# Patient Record
Sex: Male | Born: 1950 | Race: White | Hispanic: No | Smoking: Never smoker
Health system: Southern US, Community
[De-identification: ages and names within clinical notes are randomized; demographics above are authoritative.]

## PROBLEM LIST (undated history)

## (undated) DIAGNOSIS — E119 Type 2 diabetes mellitus without complications: Secondary | ICD-10-CM

## (undated) DIAGNOSIS — E785 Hyperlipidemia, unspecified: Secondary | ICD-10-CM

## (undated) HISTORY — DX: Type 2 diabetes mellitus without complications: E11.9

## (undated) HISTORY — PX: REPLACEMENT TOTAL KNEE: SUR1224

## (undated) HISTORY — DX: Hyperlipidemia, unspecified: E78.5

---

## 2018-12-14 ENCOUNTER — Encounter: Payer: Self-pay | Admitting: Internal Medicine

## 2019-02-03 ENCOUNTER — Other Ambulatory Visit: Payer: Self-pay

## 2019-02-05 ENCOUNTER — Ambulatory Visit (INDEPENDENT_AMBULATORY_CARE_PROVIDER_SITE_OTHER): Payer: Medicare Other | Admitting: Internal Medicine

## 2019-02-05 ENCOUNTER — Other Ambulatory Visit: Payer: Self-pay

## 2019-02-05 ENCOUNTER — Encounter: Payer: Self-pay | Admitting: Internal Medicine

## 2019-02-05 VITALS — BP 136/78 | HR 86 | Temp 99.3°F | Ht 68.0 in | Wt 323.4 lb

## 2019-02-05 DIAGNOSIS — E1165 Type 2 diabetes mellitus with hyperglycemia: Secondary | ICD-10-CM | POA: Diagnosis not present

## 2019-02-05 DIAGNOSIS — E785 Hyperlipidemia, unspecified: Secondary | ICD-10-CM | POA: Diagnosis not present

## 2019-02-05 DIAGNOSIS — R739 Hyperglycemia, unspecified: Secondary | ICD-10-CM

## 2019-02-05 MED ORDER — TRULICITY 0.75 MG/0.5ML ~~LOC~~ SOAJ: 0.7500 mg | SUBCUTANEOUS | 3 refills | Status: DC

## 2019-02-05 MED ORDER — GLIPIZIDE 5 MG PO TABS: 10.0000 mg | ORAL_TABLET | Freq: Every day | ORAL | 2 refills | Status: DC

## 2019-02-05 NOTE — Patient Instructions (Signed)
-   DO NOT start Acarbose  - STOP Glipizide XL  - Start Glipizide 5 mg, Two tablets before Breakfast  - Start Trulicity 1.82 mg weekly  - Continue Metformin 850 mg Two  Tablets daily    - Check sugar fasting and bedtime , you can check before you eat as well     HOW TO TREAT LOW BLOOD SUGARS (Blood sugar LESS THAN 70 MG/DL)  Please follow the RULE OF 15 for the treatment of hypoglycemia treatment (when your (blood sugars are less than 70 mg/dL)    STEP 1: Take 15 grams of carbohydrates when your blood sugar is low, which includes:   3-4 GLUCOSE TABS  OR  3-4 OZ OF JUICE OR REGULAR SODA OR  ONE TUBE OF GLUCOSE GEL     STEP 2: RECHECK blood sugar in 15 MINUTES STEP 3: If your blood sugar is still low at the 15 minute recheck --> then, go back to STEP 1 and treat AGAIN with another 15 grams of carbohydrates.

## 2019-02-05 NOTE — Progress Notes (Signed)
Name: Isaac Hamilton  MRN/ DOB: [id number], 06-Sep-1950   Age/ Sex: 68 y.o., male    PCP: Sarajane Jews, [place]   Reason for Endocrinology Evaluation: Type 2 Diabetes Mellitus     Date of Initial Endocrinology Visit: 02/06/2019     PATIENT IDENTIFIER: Isaac Hamilton is a 68 y.o. male with a past medical history of T2Dm, HTN . The patient presented for initial endocrinology clinic visit on 02/06/2019 for consultative assistance with his diabetes management.    HPI: Isaac Hamilton was    Diagnosed with T2DM  ~ 18 yrs ago  Prior Medications tried/Intolerance: Actos  Currently checking blood sugars 2-3 x / day,  before meals and bedtime  Hypoglycemia episodes : no     Hemoglobin A1c peaking at 9.4% in 2020. Patient required assistance for hypoglycemia:  Patient has required hospitalization within the last 1 year from hyper or hypoglycemia: no  In terms of diet, the patient eats a breakfast , cheese and crackers for lunch and a pop corn for supper, does not drink sugar-sweetened beverages.    HOME DIABETES REGIMEN: Acarbose 25 mg TID - Did not start yet  Glipizide ER 5 mg daily  Metformin 850 mg 2 tabs daily   Statin: yes ACE-I/ARB: No Prior Diabetic Education: yes   GLUCOSE LOG: BG's > 200    DIABETIC COMPLICATIONS: Microvascular complications:    Denies: CKD, neuropathy  Last eye exam: Completed 2019  Macrovascular complications:    Denies: CAD, PVD, CVA   PAST HISTORY: Past Medical History:  Past Medical History:  Diagnosis Date  . Diabetes mellitus ([place])   . Dyslipidemia    Past Surgical History:  Past Surgical History:  Procedure Laterality Date  . REPLACEMENT TOTAL KNEE Left       Social History:  reports that he has never smoked. He has never used smokeless tobacco. He reports that he does not drink alcohol. No history on file for drug. Family History:  Family History  Problem Relation Age of Onset  . Diabetes Mother   . Diabetes Sister       HOME MEDICATIONS: Allergies as of 02/05/2019   No Known Allergies     Medication List       Accurate as of February 05, 2019 11:59 PM. If you have any questions, ask your nurse or doctor.        STOP taking these medications   glipiZIDE 5 MG 24 hr tablet Commonly known as: GLUCOTROL XL Replaced by: glipiZIDE 5 MG tablet Stopped by: Dorita Sciara, MD     TAKE these medications   aspirin 81 MG chewable tablet Chew by mouth daily.   atorvastatin 10 MG tablet Commonly known as: LIPITOR TAKE 1 TABLET BY MOUTH ONCE PER DAY FOR 90 DAYS AT NIGHT (BLOOD WORK 90 DAYS)   citalopram 40 MG tablet Commonly known as: CELEXA Take 40 mg by mouth daily.   diclofenac 75 MG EC tablet Commonly known as: VOLTAREN TAKE 1 TABLET BY MOUTH 2 TIMES PER DAY FOR 90 DAYS   FreeStyle Libre 14 Day Sensor Misc See admin instructions.   glipiZIDE 5 MG tablet Commonly known as: GLUCOTROL Take 2 tablets (10 mg total) by mouth daily before breakfast. Replaces: glipiZIDE 5 MG 24 hr tablet Started by: Dorita Sciara, MD   loratadine 10 MG tablet Commonly known as: CLARITIN Take 10 mg by mouth daily.   meloxicam 7.5 MG tablet Commonly known as: MOBIC Take 7.5 mg by mouth  daily.   metFORMIN 850 MG tablet Commonly known as: GLUCOPHAGE TAKE 2 TABLETS BY MOUTH ONCE PER DAY FOR 90 DAYS WITH LARGEST MEAL OF THE DAY   Trulicity 0.75 MG/0.5ML Sopn Generic drug: Dulaglutide Inject 0.75 mg into the skin once a week. Started by: Scarlette ShortsIbtehal J , MD        ALLERGIES: No Known Allergies   REVIEW OF SYSTEMS: A comprehensive ROS was conducted with the patient and is negative except as per HPI and below:  Review of Systems  Constitutional: Negative for fever and weight loss.  HENT: Negative for congestion and sore throat.   Eyes: Negative for blurred vision and pain.  Respiratory: Negative for cough and shortness of breath.   Cardiovascular: Negative for chest pain and palpitations.   Gastrointestinal: Positive for constipation and diarrhea. Negative for nausea.       Alternating constipation and diarrhea.   Genitourinary: Positive for frequency.  Neurological: Positive for tingling. Negative for tremors.       Right middle finger  Endo/Heme/Allergies: Positive for polydipsia.  Psychiatric/Behavioral: Positive for depression. The patient is not nervous/anxious.       OBJECTIVE:   VITAL SIGNS: BP 136/78 (BP Location: Left Arm, Patient Position: Sitting, Cuff Size: Large)   Pulse 86   Temp 99.3 F (37.4 C)   Ht 5\' 8"  (1.727 m)   Wt (!) 323 lb 6.4 oz (146.7 kg)   SpO2 96%   BMI 49.17 kg/m    PHYSICAL EXAM:  General: Pt appears well and is in NAD  Hydration: Well-hydrated with moist mucous membranes and good skin turgor  HEENT: Head: Unremarkable with good dentition. Oropharynx clear without exudate.  Eyes: External eye exam normal without stare, lid lag or exophthalmos.  EOM intact.  PERRL.  Neck: General: Supple without adenopathy or carotid bruits. Thyroid: Thyroid size normal.  No goiter or nodules appreciated. No thyroid bruit.  Lungs: Clear with good BS bilat with no rales, rhonchi, or wheezes  Heart: RRR with normal S1 and S2 and no gallops; no murmurs; no rub  Abdomen: Normoactive bowel sounds, soft, nontender, without masses or organomegaly palpable  Extremities:  Lower extremities - trace pretibial edema. No lesions.  Skin: Normal texture and temperature to palpation. No rash noted. No Acanthosis nigricans/skin tags.   Neuro: MS is good with appropriate affect, pt is alert and Ox3    DM foot exam:   The skin of the feet is intact without sores or ulcerations. The pedal pulses are 2+ on right and 2+ on left. The sensation is intact to a screening 5.07, 10 gram monofilament bilaterally   DATA REVIEWED: 12/14/2018 Gluc 419 mg /dL  Bun/Cr 16/1.0911/0.95  GFR 82  TG 156 Urine alb/cr ratio 5  A1c 9.4% TSH 1.4   Old records , labs and images have  been reviewed.    ASSESSMENT / PLAN / RECOMMENDATIONS:   1) Type 2 Diabetes Mellitus, Poorly controlled, Without complications - Most recent A1c of 9.4 %. Goal A1c < 7.0 %.   Plan: GENERAL: I have discussed with the patient the pathophysiology of diabetes. We went over the natural progression of the disease. We talked about both insulin resistance and insulin deficiency. We stressed the importance of lifestyle changes including diet and exercise. I explained the complications associated with diabetes including retinopathy, nephropathy, neuropathy as well as increased risk of cardiovascular disease. We went over the benefit seen with glycemic control.   I explained to the patient that diabetic patients are  at higher than normal risk for amputations.   We discussed the importance of eating consistent meals during the day and avoiding snacks.   He avoids sugar-sweetened beverages  Will switch Glipizide XL to regular Glipizide, due to  Increased risk of hypoglycemia with extended release formulation in people ages > 8065.   Discussed starting a GLp-1 agonists to help with glucose and weight loss, discussed GI side effects of nausea and diarrhea. Will hold off on starting acarbose at this time  Will set him up to see our CDE on next visit with me   MEDICATIONS: - Stop Glipizide XL - Do not start Acarbose - Start Glipizide 5 mg, Two tablets before Breakfast  - Start Trulicity 0.75 mg weekly  - Continue Metformin 850 mg Two  Tablets daily    EDUCATION / INSTRUCTIONS:  BG monitoring instructions: Patient is instructed to check his blood sugars 2 times a day, before meals and bedtime.  Call [place] [facility] if: BG persistently < 70 or > 300. . I reviewed the Rule of 15 for the treatment of hypoglycemia in detail with the patient. Literature supplied.   2) Diabetic complications:   Eye: Does not have known diabetic retinopathy.   Neuro/ Feet: Does not have known diabetic  peripheral neuropathy.  Renal: Patient does not have known baseline CKD. He isnot on an ACEI/ARB at present   3) Lipids: Patient is  on a statin. LDL is at goal < 100 mg/dL.    F/u in 2 months       Signed electronically by: Lyndle HerrlichAbby Jaralla , MD  [facility]  [facility] [address] [place], [place] [address] Phone: [phone] FAX: [phone]   CC: Baldo AshMartin, Michael, FNP [address] [place] [address] Phone: [phone]  Fax: [phone]    Return to Endocrinology clinic as below: No future appointments.

## 2019-02-06 ENCOUNTER — Encounter: Payer: Self-pay | Admitting: Internal Medicine

## 2019-02-06 DIAGNOSIS — E1165 Type 2 diabetes mellitus with hyperglycemia: Secondary | ICD-10-CM | POA: Insufficient documentation

## 2019-02-06 DIAGNOSIS — E785 Hyperlipidemia, unspecified: Secondary | ICD-10-CM | POA: Insufficient documentation

## 2019-02-25 ENCOUNTER — Telehealth: Payer: Self-pay | Admitting: Internal Medicine

## 2019-02-25 NOTE — Telephone Encounter (Signed)
Fax was received with this information and has been filled out and given to physician for signature.

## 2019-02-25 NOTE — Telephone Encounter (Signed)
Marcello Moores called from [employer] regarding status of PA for Trulicity  Call back number is [phone]  Ref [facility]  Message: calling regarding insurance information

## 2019-02-26 NOTE — Telephone Encounter (Signed)
Spoke to pt and he stated that he has a different card for prescription coverage. I informed pt that we do not have that card on file and asked that he bring it in to have scanned into his chart.

## 2019-02-26 NOTE — Telephone Encounter (Signed)
Paperwork was filled out and faxed to Isaac Hamilton then sent a return faxed stating that pt does not have active coverage Medicare part D. I called to inform pt and he stated that he was told that if that was not covered then something else would be sent. Please advise

## 2019-03-14 ENCOUNTER — Other Ambulatory Visit: Payer: Self-pay

## 2019-03-14 ENCOUNTER — Telehealth: Payer: Self-pay | Admitting: Internal Medicine

## 2019-03-14 MED ORDER — METFORMIN HCL 850 MG PO TABS: ORAL_TABLET | ORAL | 0 refills | Status: DC

## 2019-03-14 NOTE — Telephone Encounter (Signed)
Sent!

## 2019-03-14 NOTE — Telephone Encounter (Signed)
Pt called needing a refill for metFORMIN (GLUCOPHAGE) 850 MG tablet.  [facility]acy is [id number] [place], [place] - [address]  Call pt @ [phone]

## 2019-04-10 ENCOUNTER — Other Ambulatory Visit: Payer: Self-pay

## 2019-04-10 ENCOUNTER — Encounter: Payer: Medicare Other | Attending: Internal Medicine | Admitting: Dietician

## 2019-04-10 ENCOUNTER — Encounter: Payer: Self-pay | Admitting: Dietician

## 2019-04-10 DIAGNOSIS — E1165 Type 2 diabetes mellitus with hyperglycemia: Secondary | ICD-10-CM | POA: Diagnosis not present

## 2019-04-10 NOTE — Progress Notes (Signed)
Diabetes Self-Management Education  Visit Type: First/Initial  Appt. Start Time: 1040 Appt. End Time: 1200  04/13/2019  Mr. Isaac Hamilton, identified by name and date of birth, is a 68 y.o. male with a diagnosis of Diabetes: Type 2.   ASSESSMENT Patient is here today alone.  He would like to learn how to keep the BG below 180.  States that the only time he saw 100 was when he fasted for 18 hours due to being exhausted after traveling.    History includes: Type 2 diabetes since about Nov 18, 2000, HTN. Weight today 328 lbs A1C 9.4% 2020 Medications include Glipizide, Metformin, Trulicity  Patient lives alone.  His wife died in 11/19/2015 or 11-18-2016.  He moved from [place] in 2017/11/18 to be closer to his son.  He is a retired Engineer, maintenance (IT).  He does his own shopping and cooking.  Cooks little and very simple.  Few vegetables and fruits.   Height 5\' 10"  (1.778 m), weight (!) 328 lb (148.8 kg). Body mass index is 47.06 kg/m.  Diabetes Self-Management Education - 04/10/19 1107      Visit Information   Visit Type  First/Initial      Initial Visit   Diabetes Type  Type 2    Are you currently following a meal plan?  No    Are you taking your medications as prescribed?  Yes    Date Diagnosed  11-18-2000      Health Coping   How would you rate your overall health?  Fair      Psychosocial Assessment   Patient Belief/Attitude about Diabetes  Motivated to manage diabetes    Self-care barriers  None    Self-management support  Doctor's office    Other persons present  Patient    Patient Concerns  Nutrition/Meal planning;Glycemic Control    Special Needs  None    Preferred Learning Style  No preference indicated    Learning Readiness  Ready    How often do you need to have someone help you when you read instructions, pamphlets, or other written materials from your doctor or pharmacy?  1 - Never    What is the last grade level you completed in school?  5 years college, Master's degree      Pre-Education Assessment   Patient understands the diabetes disease and treatment process.  Needs Review    Patient understands incorporating nutritional management into lifestyle.  Needs Review    Patient undertands incorporating physical activity into lifestyle.  Needs Review    Patient understands using medications safely.  Needs Review    Patient understands monitoring blood glucose, interpreting and using results  Needs Review    Patient understands prevention, detection, and treatment of acute complications.  Needs Review    Patient understands prevention, detection, and treatment of chronic complications.  Needs Review    Patient understands how to develop strategies to address psychosocial issues.  Needs Review    Patient understands how to develop strategies to promote health/change behavior.  Needs Review      Complications   Last HgB A1C per patient/outside source  9.4 %   2020   How often do you check your blood sugar?  > 4 times/day   FreeStyle Libre   Fasting Blood glucose range (mg/dL)  130-179;180-200    Postprandial Blood glucose range (mg/dL)  >200    Number of hypoglycemic episodes per month  0    Number of hyperglycemic episodes per week  21  Can you tell when your blood sugar is high?  Yes    What do you do if your blood sugar is high?  drinks water    Have you had a dilated eye exam in the past 12 months?  No    Have you had a dental exam in the past 12 months?  No   no teeth, doesn't wear dentures   Are you checking your feet?  Yes    How many days per week are you checking your feet?  7      Dietary Intake   Breakfast  eggs, sausage, 2 slices [employer] bread, butter, jelly, french fries OR salad with cheese, bacon, raisins, [place] dressing, roasted chicken, wheat crackers    Snack (morning)  none    Lunch  skips    Dinner  mostly skips, occasional burger or popcorn or toast or cheese    Snack (evening)  slim jims, popcorn    Beverage(s)  water, Sugar free peach drink, diet Pepsi,  [employer]bucks vanilla sugar free syrup      Exercise   Exercise Type  Light (walking / raking leaves)   walks to shooting range     Patient Education   Previous Diabetes Education  Yes (please comment)   18 years ago when diagnosed   Disease state   Definition of diabetes, type 1 and 2, and the diagnosis of diabetes    Nutrition management   Role of diet in the treatment of diabetes and the relationship between the three main macronutrients and blood glucose level;Information on hints to eating out and maintain blood glucose control.;Meal options for control of blood glucose level and chronic complications.;Meal timing in regards to the patients' current diabetes medication.    Physical activity and exercise   Role of exercise on diabetes management, blood pressure control and cardiac health.;Helped patient identify appropriate exercises in relation to his/her diabetes, diabetes complications and other health issue.    Medications  Reviewed patients medication for diabetes, action, purpose, timing of dose and side effects.    Monitoring  Purpose and frequency of SMBG.;Identified appropriate SMBG and/or A1C goals.    Acute complications  Taught treatment of hypoglycemia - the 15 rule.;Discussed and identified patients' treatment of hyperglycemia.    Chronic complications  Relationship between chronic complications and blood glucose control;Identified and discussed with patient  current chronic complications    Psychosocial adjustment  Worked with patient to identify barriers to care and solutions;Role of stress on diabetes;Identified and addressed patients feelings and concerns about diabetes      Individualized Goals (developed by patient)   Nutrition  General guidelines for healthy choices and portions discussed    Physical Activity  Exercise 3-5 times per week;15 minutes per day    Medications  take my medication as prescribed    Monitoring   test my blood glucose as discussed       Post-Education Assessment   Patient understands the diabetes disease and treatment process.  Demonstrates understanding / competency    Patient understands incorporating nutritional management into lifestyle.  Needs Review    Patient undertands incorporating physical activity into lifestyle.  Demonstrates understanding / competency    Patient understands using medications safely.  Demonstrates understanding / competency    Patient understands monitoring blood glucose, interpreting and using results  Demonstrates understanding / competency    Patient understands prevention, detection, and treatment of acute complications.  Demonstrates understanding / competency    Patient understands prevention, detection,  and treatment of chronic complications.  Demonstrates understanding / competency    Patient understands how to develop strategies to address psychosocial issues.  Demonstrates understanding / competency    Patient understands how to develop strategies to promote health/change behavior.  Needs Review      Outcomes   Expected Outcomes  Other (comment)   demonstrated interest in learning but question ability and desire to change   Future DMSE  6 months    Program Status  Completed       Individualized Plan for Diabetes Self-Management Training:   Learning Objective:  Patient will have a greater understanding of diabetes self-management. Patient education plan is to attend individual and/or group sessions per assessed needs and concerns.   Plan:   Patient Instructions  Consider using less raisins and less dressing in your salad. Aim for 3 meals per day rather than grazing. Have a healthy frozen meal if you are not able to cook. If you are hungry for a snack then choose something that is protein without carbohydrates. Bake rather than fry. Increase your vegetables. Choose 1-2 pieces of fruit per day with a meal. Spread the amount of carbohydrate throughout the day.  Aim for 3-4 Carb  Choices per meal (45-60 grams) +/- 1 either way  Include protein in moderation with your meals and snacks Consider reading food labels for Total Carbohydrate of foods Find something to be active. Continue checking BG at alternate times per day  Continue taking medication as directed by MD      Expected Outcomes:  Other (comment)(demonstrated interest in learning but question ability and desire to change)  Education material provided: ADA - How to Thrive: A Guide for Your Journey with Diabetes, Meal plan card, Snack sheet and Carbohydrate counting sheet, eating out tips  If problems or questions, patient to contact team via:  Phone  Future DSME appointment: 6 months

## 2019-04-10 NOTE — Patient Instructions (Addendum)
Consider using less raisins and less dressing in your salad. Aim for 3 meals per day rather than grazing. Have a healthy frozen meal if you are not able to cook. If you are hungry for a snack then choose something that is protein without carbohydrates. Bake rather than fry. Increase your vegetables. Choose 1-2 pieces of fruit per day with a meal. Spread the amount of carbohydrate throughout the day.  Aim for 3-4 Carb Choices per meal (45-60 grams) +/- 1 either way  Include protein in moderation with your meals and snacks Consider reading food labels for Total Carbohydrate of foods Find something to be active. Continue checking BG at alternate times per day  Continue taking medication as directed by MD

## 2019-04-11 ENCOUNTER — Telehealth: Payer: Self-pay

## 2019-04-11 NOTE — Telephone Encounter (Signed)
Pt was in office yesterday to see Mickel Baas and pt brought in correct insurance card so that I could restart PA for trulicity. PA has been sent thru covermymeds and awaiting decision.

## 2019-05-05 ENCOUNTER — Telehealth: Payer: Self-pay | Admitting: Internal Medicine

## 2019-05-05 ENCOUNTER — Other Ambulatory Visit: Payer: Self-pay

## 2019-05-05 MED ORDER — FREESTYLE LIBRE 14 DAY SENSOR MISC: 1.0000 | 1 refills | Status: DC

## 2019-05-05 NOTE — Telephone Encounter (Signed)
Rx sent 

## 2019-05-05 NOTE — Telephone Encounter (Signed)
MEDICATION: Free Style Libre sensor  [facility] [place]  IS THIS A 90 DAY SUPPLY : no  IS PATIENT OUT OF MEDICATION: yes  IF NOT; HOW MUCH IS LEFT:   LAST APPOINTMENT DATE: @9 /25/2020  NEXT APPOINTMENT DATE:@Visit  date not found  DO WE HAVE YOUR PERMISSION TO LEAVE A DETAILED MESSAGE: yes - [phone]  OTHER COMMENTS:    **Let patient know to contact pharmacy at the end of the day to make sure medication is ready. **  ** Please notify patient to allow 48-72 hours to process**  **Encourage patient to contact the pharmacy for refills or they can request refills through [facility]**

## 2019-05-30 ENCOUNTER — Other Ambulatory Visit: Payer: Self-pay

## 2019-06-03 ENCOUNTER — Encounter: Payer: Self-pay | Admitting: Internal Medicine

## 2019-06-03 ENCOUNTER — Other Ambulatory Visit: Payer: Self-pay

## 2019-06-03 ENCOUNTER — Ambulatory Visit (INDEPENDENT_AMBULATORY_CARE_PROVIDER_SITE_OTHER): Payer: Medicare Other | Admitting: Internal Medicine

## 2019-06-03 VITALS — BP 132/72 | HR 89 | Resp 16 | Ht 70.0 in | Wt 339.2 lb

## 2019-06-03 DIAGNOSIS — E1165 Type 2 diabetes mellitus with hyperglycemia: Secondary | ICD-10-CM

## 2019-06-03 MED ORDER — GLIPIZIDE 5 MG PO TABS: 10.0000 mg | ORAL_TABLET | Freq: Two times a day (BID) | ORAL | 1 refills | Status: DC

## 2019-06-03 NOTE — Progress Notes (Signed)
Name: Isaac Hamilton  Age/ Sex: 68 y.o., male   MRN/ DOB: [id number], 12-07-1950     PCP: Baldo Ash, FNP   Reason for Endocrinology Evaluation: Type 2 Diabetes Mellitus  Initial Endocrine Consultative Visit: 02/05/2019    PATIENT IDENTIFIER: Isaac Hamilton is a 67 y.o. male with a past medical history of HTN, and DM. The patient has followed with Endocrinology clinic since 02/05/2019 for consultative assistance with management of his diabetes.  DIABETIC HISTORY:  Isaac Hamilton was diagnosed with DM > 18 yrs ago. He has been on actos in the past, on his initial  Visit he was on Glipizide XL and metformin, he had a prescription for Acarbose but did not start it, we opted to not start Acarbose and started Trulicity. His hemoglobin A1c has peaked at 9.4% in 2020.   SUBJECTIVE:   During the last visit (02/05/2019): Changed Glipizide XL to regular release, started Trulicity and continued metformin  Today (06/03/2019): Isaac Hamilton is here for a 3 month follow up on diabetes management.  He checks his blood sugars multiple times daily through freestyle [place]. The patient has not had hypoglycemic episodes since the last clinic visit. He has not been able to obtain the trulicity. He admits to dietary indiscretions.  Otherwise, the patient has not required any recent emergency interventions for hypoglycemia and has not had recent hospitalizations secondary to hyper or hypoglycemic episodes.    ROS: As per HPI and as detailed below: Review of Systems  Constitutional: Negative for chills and fever.  HENT: Negative for congestion and sore throat.   Respiratory: Negative for cough and shortness of breath.   Cardiovascular: Negative for chest pain and palpitations.  Gastrointestinal: Negative for diarrhea and nausea.      HOME DIABETES REGIMEN:  - Glipizide 5 mg, Two tablets before Breakfast  - Trulicity 0.75 mg weekly  - Metformin 850 mg Two  Tablets daily     CONTINUOUS GLUCOSE MONITORING  RECORD INTERPRETATION    Dates of Recording: 11/4-11/17/2020  Sensor description:Freestyle Libre  Results statistics:   CGM use % of time 45  Average and SD 245/19.9  Time in range     6  %  % Time Above 180 53  % Time above 250 41  % Time Below target 0    Glycemic patterns summary: pt above goal through the day and night   Hyperglycemic episodes  Mainly after supper  Hypoglycemic episodes occurred N/A  Overnight periods: Stable        HISTORY:  Past Medical History:  Past Medical History:  Diagnosis Date  . Diabetes mellitus (HCC)   . Dyslipidemia    Past Surgical History:  Past Surgical History:  Procedure Laterality Date  . REPLACEMENT TOTAL KNEE Left     Social History:  reports that he has never smoked. He has never used smokeless tobacco. He reports that he does not drink alcohol. No history on file for drug. Family History:  Family History  Problem Relation Age of Onset  . Diabetes Mother   . Diabetes Sister      HOME MEDICATIONS: Allergies as of 06/03/2019   No Known Allergies     Medication List       Accurate as of June 03, 2019  1:30 PM. If you have any questions, ask your nurse or doctor.        aspirin 81 MG chewable tablet Chew by mouth daily.   atorvastatin 10 MG tablet Commonly known  as: LIPITOR TAKE 1 TABLET BY MOUTH ONCE PER DAY FOR 90 DAYS AT NIGHT (BLOOD WORK 90 DAYS)   citalopram 40 MG tablet Commonly known as: CELEXA Take 40 mg by mouth daily.   diclofenac 75 MG EC tablet Commonly known as: VOLTAREN TAKE 1 TABLET BY MOUTH 2 TIMES PER DAY FOR 90 DAYS   FreeStyle Libre 14 Day Sensor Misc 1 each by Other route See admin instructions.   glipiZIDE 5 MG tablet Commonly known as: GLUCOTROL Take 2 tablets (10 mg total) by mouth daily before breakfast.   loratadine 10 MG tablet Commonly known as: CLARITIN Take 10 mg by mouth daily.   meloxicam 7.5 MG tablet Commonly known as: MOBIC Take 7.5 mg by mouth daily.    metFORMIN 850 MG tablet Commonly known as: GLUCOPHAGE TAKE 2 TABLETS BY MOUTH ONCE PER DAY FOR 90 DAYS WITH LARGEST MEAL OF THE DAY   multivitamin with minerals Tabs tablet Take 1 tablet by mouth daily.   Trulicity 0.75 MG/0.5ML Sopn Generic drug: Dulaglutide Inject 0.75 mg into the skin once a week.        OBJECTIVE:   Vital Signs: BP 132/72 (BP Location: Right Wrist)   Pulse 89   Resp 16   Ht 5\' 10"  (1.778 m)   Wt (!) 339 lb 3.2 oz (153.9 kg)   SpO2 96%   BMI 48.67 kg/m   Wt Readings from Last 3 Encounters:  06/03/19 (!) 339 lb 3.2 oz (153.9 kg)  04/10/19 (!) 328 lb (148.8 kg)  02/05/19 (!) 323 lb 6.4 oz (146.7 kg)     Exam: General: Pt appears well and is in NAD  Neck: General: Supple without adenopathy. Thyroid: Thyroid size normal.  No goiter or nodules appreciated. No thyroid bruit.  Lungs: Clear with good BS bilat with no rales, rhonchi, or wheezes  Heart: RRR with normal S1 and S2 and no gallops; no murmurs; no rub  Abdomen: Normoactive bowel sounds, soft, nontender, without masses or organomegaly palpable  Extremities: No pretibial edema. No tremor. Normal strength and motion throughout. See detailed diabetic foot exam below.  Neuro: MS is good with appropriate affect, pt is alert and Ox3      DM foot exam: 06/03/2019  The skin of the feet is intact without sores or ulcerations. The pedal pulses are 2+ on right and 2+ on left. The sensation is intact to a screening 5.07, 10 gram monofilament bilaterally    DATA REVIEWED:  03/16/19 A1c 8.4 % BUn/Cr 18/0.94 GFR 84   ASSESSMENT / PLAN / RECOMMENDATIONS:   1) Type 2 Diabetes Mellitus, Poorly controlled, Without complications - Most recent A1c of 8.4 %. Goal A1c < 7.0%.    - Despite continued hyperglycemia on CGM , his A1c has been trending down. Unfortunately he has not been able to obtain the Trulicity yet. Will follow up on PA for that .  - In the meantime will increase Glipizide as below  -  We discussed the importance of dietary discretions in controlling DM.     MEDICATIONS: - Glipizide 5 mg, Two tablets before Breakfast and Before supper  - Continue Metformin 850 mg Two  Tablets daily  - Trulicity 0.75 mg weekly    EDUCATION / INSTRUCTIONS:  BG monitoring instructions: Patient is instructed to check his blood sugars 2 times a day, fasting and supper .  Call [facility] if: BG persistently < 70 or > 300. . I reviewed the Rule of 15 for the treatment  of hypoglycemia in detail with the patient. Literature supplied.    F/U in 3 months    Signed electronically by: Mack Guise, MD  [facility] Endocrin[facility]  [facility] [address] [place], [place] [address] Phone: [phone] FAX: [phone]   CC: [address], [place] [address] Phone: [phone]  Fax: [phone]  Return to Endocrinology clinic as below: No future appointments.

## 2019-06-03 NOTE — Patient Instructions (Addendum)
-   Glipizide 5 mg, Two tablets before Breakfast and Before supper  -  We will check on the status of Trulicity 4.33 mg weekly prescription - Continue Metformin 850 mg Two  Tablets daily    - Check sugar before meals     HOW TO TREAT LOW BLOOD SUGARS (Blood sugar LESS THAN 70 MG/DL)  Please follow the RULE OF 15 for the treatment of hypoglycemia treatment (when your (blood sugars are less than 70 mg/dL)    STEP 1: Take 15 grams of carbohydrates when your blood sugar is low, which includes:   3-4 GLUCOSE TABS  OR  3-4 OZ OF JUICE OR REGULAR SODA OR  ONE TUBE OF GLUCOSE GEL     STEP 2: RECHECK blood sugar in 15 MINUTES STEP 3: If your blood sugar is still low at the 15 minute recheck --> then, go back to STEP 1 and treat AGAIN with another 15 grams of carbohydrates.

## 2019-06-04 ENCOUNTER — Telehealth: Payer: Self-pay | Admitting: Internal Medicine

## 2019-06-04 ENCOUNTER — Other Ambulatory Visit: Payer: Self-pay | Admitting: Internal Medicine

## 2019-06-04 NOTE — Telephone Encounter (Signed)
PLease follow up on the Trulicity PA from September ,2020   Thanks   Bettles, MD  [facility] Endocrinology  [facility] [address] [place], [place] [address] Phone: [phone] FAX: [phone]

## 2019-06-04 NOTE — Telephone Encounter (Signed)
Wasn't able to locate PA but I did call pharmacy to see if they can run the rx through insurance. They stated it ran through but it is going to cost pt $364.40 that is with his copay.

## 2019-06-05 NOTE — Telephone Encounter (Signed)
Saw that there was a scanned medicare medication exception form scanned in chart. Called # on form but they are stating pt's insurance is not active. Attempted to reach pt to verify correct insurance, no answer will call back. Due to not have the key for CMM not able to check on status. Asked Isaac Hamilton is was unaware as well of how to do it.

## 2019-07-06 ENCOUNTER — Other Ambulatory Visit: Payer: Self-pay | Admitting: Internal Medicine

## 2019-08-30 ENCOUNTER — Other Ambulatory Visit: Payer: Self-pay | Admitting: Internal Medicine

## 2019-09-01 ENCOUNTER — Other Ambulatory Visit: Payer: Self-pay

## 2019-09-02 ENCOUNTER — Encounter: Payer: Self-pay | Admitting: Internal Medicine

## 2019-09-02 ENCOUNTER — Ambulatory Visit (INDEPENDENT_AMBULATORY_CARE_PROVIDER_SITE_OTHER): Payer: Medicare Other | Admitting: Internal Medicine

## 2019-09-02 VITALS — BP 128/64 | HR 117 | Temp 98.3°F | Ht 70.0 in | Wt 338.2 lb

## 2019-09-02 DIAGNOSIS — E1165 Type 2 diabetes mellitus with hyperglycemia: Secondary | ICD-10-CM

## 2019-09-02 LAB — POCT GLYCOSYLATED HEMOGLOBIN (HGB A1C): Hemoglobin A1C: 9.7 % — AB (ref 4.0–5.6)

## 2019-09-02 MED ORDER — GLIPIZIDE 10 MG PO TABS: 20.0000 mg | ORAL_TABLET | Freq: Two times a day (BID) | ORAL | 3 refills | Status: DC

## 2019-09-02 NOTE — Progress Notes (Signed)
Name: Isaac Hamilton  Age/ Sex: 69 y.o., male   MRN/ DOB: [id number], 08-09-1950     PCP: Baldo Ash, FNP   Reason for Endocrinology Evaluation: Type 2 Diabetes Mellitus  Initial Endocrine Consultative Visit: 02/05/2019    Hamilton IDENTIFIER: Isaac Hamilton is a 69 y.o. male with a past medical history of HTN, and DM. Isaac Hamilton has followed with Endocrinology clinic since 02/05/2019 for consultative assistance with management of his diabetes.  DIABETIC HISTORY:  Isaac Hamilton was diagnosed with DM > 18 yrs ago. Isaac Hamilton has been on actos in Isaac past, on his initial  Visit Isaac Hamilton was on Glipizide XL and metformin, Isaac Hamilton had a prescription for Acarbose but did not start it, we opted to not start Acarbose and started Trulicity. His hemoglobin A1c has peaked at 9.4% in 2020.  Unable to obtain trulicity due to cost   SUBJECTIVE:   During Isaac last visit (06/03/2019): A1c 8.4%.  Continued glipizide/Metformin and Trulicity    Today (09/02/2019): Isaac Hamilton is here for a 3 month follow up on diabetes management.  Isaac Hamilton checks his blood sugars multiple times daily through freestyle [place]. Isaac Hamilton has not had hypoglycemic episodes since Isaac last clinic visit. Isaac Hamilton has not been able to obtain Isaac trulicity due to cost . Isaac Hamilton admits to dietary indiscretions.  Otherwise, Isaac Hamilton has not required any recent emergency interventions for hypoglycemia and has not had recent hospitalizations secondary to hyper or hypoglycemic episodes.    ROS: As per HPI and as detailed below: Review of Systems  Constitutional: Negative for chills and fever.  HENT: Negative for congestion and sore throat.   Respiratory: Negative for cough and shortness of breath.   Cardiovascular: Negative for chest pain and palpitations.  Gastrointestinal: Negative for diarrhea and nausea.      HOME DIABETES REGIMEN:  - Glipizide 5 mg, Two tablets before Breakfast and dinner - Trulicity 0.75 mg weekly - not taking  - Metformin 850 mg Two  Tablets daily     CONTINUOUS GLUCOSE MONITORING RECORD INTERPRETATION    Dates of Recording: 2/2-2/15/2021  Sensor description:Freestyle Libre  Results statistics:   CGM use % of time 53  Average and SD 256/17.9  Time in range     6 %  % Time Above 180 44  % Time above 250 50  % Time Below target 0    Glycemic patterns summary: Isaac Hamilton above goal through Isaac day and night   Hyperglycemic episodes  Mainly after supper  Hypoglycemic episodes occurred N/A  Overnight periods: Stable    DIABETIC COMPLICATIONS: Microvascular complications:    Denies: CKD, neuropathy  Last eye exam: Completed 2019  Macrovascular complications:    Denies: CAD, PVD, CVA    HISTORY:  Past Medical History:  Past Medical History:  Diagnosis Date  . Diabetes mellitus (HCC)   . Dyslipidemia    Past Surgical History:  Past Surgical History:  Procedure Laterality Date  . REPLACEMENT TOTAL KNEE Left     Social History:  reports that Isaac Hamilton has never smoked. Isaac Hamilton has never used smokeless tobacco. Isaac Hamilton reports that Isaac Hamilton does not drink alcohol. No history on file for drug. Family History:  Family History  Problem Relation Age of Onset  . Diabetes Mother   . Diabetes Sister      HOME MEDICATIONS: Allergies as of 09/02/2019   No Known Allergies     Medication List       Accurate as of September 02, 2019  2:32 PM. If you have any questions, ask your nurse or doctor.        STOP taking these medications   Trulicity 7.56 EP/3.2RJ Sopn Generic drug: Dulaglutide Stopped by: Dorita Sciara, MD     TAKE these medications   aspirin 81 MG chewable tablet Chew by mouth daily.   atorvastatin 10 MG tablet Commonly known as: LIPITOR TAKE 1 TABLET BY MOUTH ONCE PER DAY FOR 90 DAYS AT NIGHT (BLOOD WORK 90 DAYS)   citalopram 40 MG tablet Commonly known as: CELEXA Take 40 mg by mouth daily.   diclofenac 75 MG EC tablet Commonly known as: VOLTAREN TAKE 1 TABLET BY MOUTH 2 TIMES PER  DAY FOR 90 DAYS   FreeStyle Libre 14 Day Sensor Misc USE AS DIRECTED   glipiZIDE 10 MG tablet Commonly known as: GLUCOTROL Take 2 tablets (20 mg total) by mouth 2 (two) times daily before a meal. What changed:   medication strength  how much to take Changed by: Dorita Sciara, MD   loratadine 10 MG tablet Commonly known as: CLARITIN Take 10 mg by mouth daily.   meloxicam 7.5 MG tablet Commonly known as: MOBIC Take 7.5 mg by mouth daily.   metFORMIN 850 MG tablet Commonly known as: GLUCOPHAGE TAKE 2 TABLETS BY MOUTH ONCE PER DAY FOR 90 DAYS WITH LARGEST MEAL OF Isaac DAY   multivitamin with minerals Tabs tablet Take 1 tablet by mouth daily.        OBJECTIVE:   Vital Signs: BP 128/64 (BP Location: Right Arm, Hamilton Position: Lying right side)   Pulse (!) 117   Temp 98.3 F (36.8 C)   Ht 5\' 10"  (1.778 m)   Wt (!) 338 lb 3.2 oz (153.4 kg)   SpO2 95%   BMI 48.53 kg/m   Wt Readings from Last 3 Encounters:  09/02/19 (!) 338 lb 3.2 oz (153.4 kg)  06/03/19 (!) 339 lb 3.2 oz (153.9 kg)  04/10/19 (!) 328 lb (148.8 kg)     Exam: General: Isaac Hamilton appears well and is in NAD  Neck: General: Supple without adenopathy. Thyroid: Thyroid size normal.  No goiter or nodules appreciated. No thyroid bruit.  Lungs: Clear with good BS bilat with no rales, rhonchi, or wheezes  Heart: RRR with normal S1 and S2 and no gallops; no murmurs; no rub  Abdomen: Normoactive bowel sounds, soft, nontender, without masses or organomegaly palpable  Extremities: No pretibial edema.   Neuro: MS is good with appropriate affect, Isaac Hamilton is alert and Ox3      DM foot exam: 06/03/2019  Isaac skin of Isaac feet is intact without sores or ulcerations. Isaac pedal pulses are 2+ on right and 2+ on left. Isaac sensation is intact to a screening 5.07, 10 gram monofilament bilaterally    DATA REVIEWED: 12/14/2018 Gluc 419 mg /dL  Bun/Cr 11/0.95  GFR 82  TG 156 Urine alb/cr ratio 5  A1c 9.4% TSH  1.4    03/16/19 A1c 8.4 % BUn/Cr 18/0.94 GFR 84   ASSESSMENT / PLAN / RECOMMENDATIONS:   1) Type 2 Diabetes Mellitus, Poorly controlled, Without complications - Most recent A1c of 9.7 %. Goal A1c < 7.0%.    - Isaac Hamilton continues with worsening hyperglycemia.  His barriers to diabetes care is depression and financial hardship. Isaac Hamilton unable to obtain trulicity due to cost.  - I have explained to Isaac Hamilton that his current regimen is not working for Isaac Hamilton, we discussed that insulin would be Isaac safest and quickest  way of  Improving his glycemic control.  Isaac Hamilton is reluctant to this due to multiple daily injectables and Isaac cost. We discussed that we could always use Isaac [employer] brand if his insurance won;t cover.  - Isaac Hamilton declined insulin at this time and will increase Glipizide as below    MEDICATIONS: - Glipizide 10 mg, Two tablets before Breakfast and Before supper  - Continue Metformin 850 mg Two  Tablets daily     EDUCATION / INSTRUCTIONS:  BG monitoring instructions: Hamilton is instructed to check his blood sugars 2 times a day, fasting and supper .  Call [facility] if: BG persistently < 70 or > 300. . I reviewed Isaac Rule of 15 for Isaac treatment of hypoglycemia in detail with Isaac Hamilton. Literature supplied.    F/U in 3 months    Signed electronically by: Lyndle Herrlich, MD  [facility] Endocrinology  [facility] [address] [place] [place], [place] [address] Phone: [phone] FAX: [phone]   CC: Baldo Ash, FNP [address] [place] [address] Phone: [phone]  Fax: [phone]  Return to Endocrinology clinic as below: Future Appointments  Date Time Provider Department Center  12/04/2019 10:50 AM Teran Knittle, Konrad Dolores, MD [id number] None

## 2019-09-02 NOTE — Patient Instructions (Signed)
-   Change Glipizide to 10 mg , Take 2 Tablets before Breakfast and Before supper - Continue Metformin 850 mg twice daily      - HOW TO TREAT LOW BLOOD SUGARS (Blood sugar LESS THAN 70 MG/DL)  Please follow the RULE OF 15 for the treatment of hypoglycemia treatment (when your (blood sugars are less than 70 mg/dL)    STEP 1: Take 15 grams of carbohydrates when your blood sugar is low, which includes:   3-4 GLUCOSE TABS  OR  3-4 OZ OF JUICE OR REGULAR SODA OR  ONE TUBE OF GLUCOSE GEL     STEP 2: RECHECK blood sugar in 15 MINUTES STEP 3: If your blood sugar is still low at the 15 minute recheck --> then, go back to STEP 1 and treat AGAIN with another 15 grams of carbohydrates.

## 2019-09-03 ENCOUNTER — Encounter: Payer: Self-pay | Admitting: Internal Medicine

## 2019-09-05 ENCOUNTER — Other Ambulatory Visit: Payer: Self-pay

## 2019-09-05 MED ORDER — FREESTYLE LIBRE 14 DAY SENSOR MISC: 1 refills | Status: DC

## 2019-10-20 ENCOUNTER — Other Ambulatory Visit: Payer: Self-pay | Admitting: Internal Medicine

## 2019-11-25 ENCOUNTER — Other Ambulatory Visit: Payer: Self-pay | Admitting: Internal Medicine

## 2019-11-26 ENCOUNTER — Other Ambulatory Visit: Payer: Self-pay | Admitting: Internal Medicine

## 2019-12-02 ENCOUNTER — Other Ambulatory Visit: Payer: Self-pay

## 2019-12-04 ENCOUNTER — Ambulatory Visit (INDEPENDENT_AMBULATORY_CARE_PROVIDER_SITE_OTHER): Payer: Medicare Other | Admitting: Internal Medicine

## 2019-12-04 ENCOUNTER — Encounter: Payer: Self-pay | Admitting: Internal Medicine

## 2019-12-04 ENCOUNTER — Other Ambulatory Visit: Payer: Self-pay

## 2019-12-04 VITALS — BP 118/68 | HR 96 | Temp 98.6°F | Ht 70.0 in | Wt 322.2 lb

## 2019-12-04 DIAGNOSIS — E1165 Type 2 diabetes mellitus with hyperglycemia: Secondary | ICD-10-CM

## 2019-12-04 LAB — POCT GLYCOSYLATED HEMOGLOBIN (HGB A1C): Hemoglobin A1C: 9.1 % — AB (ref 4.0–5.6)

## 2019-12-04 MED ORDER — FREESTYLE LIBRE 14 DAY SENSOR MISC: 1.0000 | 3 refills | Status: AC

## 2019-12-04 MED ORDER — OZEMPIC (0.25 OR 0.5 MG/DOSE) 2 MG/1.5ML ~~LOC~~ SOPN: 0.5000 mg | PEN_INJECTOR | SUBCUTANEOUS | 6 refills | Status: AC

## 2019-12-04 NOTE — Patient Instructions (Addendum)
-  Glipizide 10 mg , Take 2 Tablets before Breakfast and 2 tablets Before supper - Continue Metformin 850 mg twice daily  - Ozempic 0.5 mg weekly      - HOW TO TREAT LOW BLOOD SUGARS (Blood sugar LESS THAN 70 MG/DL)  Please follow the RULE OF 15 for the treatment of hypoglycemia treatment (when your (blood sugars are less than 70 mg/dL)    STEP 1: Take 15 grams of carbohydrates when your blood sugar is low, which includes:   3-4 GLUCOSE TABS  OR  3-4 OZ OF JUICE OR REGULAR SODA OR  ONE TUBE OF GLUCOSE GEL     STEP 2: RECHECK blood sugar in 15 MINUTES STEP 3: If your blood sugar is still low at the 15 minute recheck --> then, go back to STEP 1 and treat AGAIN with another 15 grams of carbohydrates.

## 2019-12-04 NOTE — Progress Notes (Signed)
Name: Isaac Hamilton  Age/ Sex: 69 y.o., male   MRN/ DOB: [id number], 04-08-1951     PCP: Baldo Ash, FNP   Reason for Endocrinology Evaluation: Type 2 Diabetes Mellitus  Initial Endocrine Consultative Visit: 02/05/2019    PATIENT IDENTIFIER: Isaac Hamilton is a 69 y.o. male with a past medical history of HTN, and DM. The patient has followed with Endocrinology clinic since 02/05/2019 for consultative assistance with management of his diabetes.  DIABETIC HISTORY:  Isaac Hamilton was diagnosed with DM > 18 yrs ago. He has been on actos in the past, on his initial  Visit he was on Glipizide XL and metformin, he had a prescription for Acarbose but did not start it, we opted to not start Acarbose and started Trulicity. His hemoglobin A1c has peaked at 9.4% in 2020.  Trulicity is cost prohibitive   SUBJECTIVE:   During the last visit (09/02/2019): A1c 9.7 %.  Pt was offered insulin due to worsening hyperglycemia, but opted with Glipizide and Metformin.     Today (12/04/2019): Isaac Hamilton is here for a 3 month follow up on diabetes management.  He checks his blood sugars multiple times daily through freestyle [place]. The patient has not had hypoglycemic episodes since the last clinic visit.  He was able to obtain Ozempic samples through his PCP, he is currently on 0.25 mg weekly, he has been tolerating this well without any side effects.      HOME DIABETES REGIMEN:  - Glipizide 10 mg, Two tablets before Breakfast and dinner - Metformin 850 mg Two Tablets daily - Ozempic 0.25 mg weekly ( samples from PCP)     CONTINUOUS GLUCOSE MONITORING RECORD INTERPRETATION    Dates of Recording: 5/6-5/19/2021  Sensor description:Freestyle Libre  Results statistics:   CGM use % of time 59  Average and SD 222/26.6  Time in range     28 %  % Time Above 180 44  % Time above 250 28  % Time Below target 0    Glycemic patterns summary: pt above goal through the day and most of the night, BG's  trend to goal by 6 AM  Hyperglycemic episodes  Post-prandial  Hypoglycemic episodes occurred N/A  Overnight periods: trending down   DIABETIC COMPLICATIONS: Microvascular complications:    Denies: CKD, neuropathy  Last eye exam: Completed 2019  Macrovascular complications:    Denies: CAD, PVD, CVA    HISTORY:  Past Medical History:  Past Medical History:  Diagnosis Date  . Diabetes mellitus (HCC)   . Dyslipidemia    Past Surgical History:  Past Surgical History:  Procedure Laterality Date  . REPLACEMENT TOTAL KNEE Left     Social History:  reports that he has never smoked. He has never used smokeless tobacco. He reports that he does not drink alcohol. No history on file for drug. Family History:  Family History  Problem Relation Age of Onset  . Diabetes Mother   . Diabetes Sister      HOME MEDICATIONS: Allergies as of 12/04/2019   No Known Allergies     Medication List       Accurate as of Dec 04, 2019 10:59 AM. If you have any questions, ask your nurse or doctor.        aspirin 81 MG chewable tablet Chew by mouth daily.   atorvastatin 10 MG tablet Commonly known as: LIPITOR TAKE 1 TABLET BY MOUTH ONCE PER DAY FOR 90 DAYS AT NIGHT (BLOOD WORK 90  DAYS)   citalopram 40 MG tablet Commonly known as: CELEXA Take 40 mg by mouth daily.   diclofenac 75 MG EC tablet Commonly known as: VOLTAREN TAKE 1 TABLET BY MOUTH 2 TIMES PER DAY FOR 90 DAYS   FreeStyle Libre 14 Day Sensor Misc USE AS DIRECTED E11.65   glipiZIDE 10 MG tablet Commonly known as: GLUCOTROL TAKE 2 TABLETS (20 MG TOTAL) BY MOUTH 2 (TWO) TIMES DAILY BEFORE A MEAL.   loratadine 10 MG tablet Commonly known as: CLARITIN Take 10 mg by mouth daily.   meloxicam 7.5 MG tablet Commonly known as: MOBIC Take 7.5 mg by mouth daily.   metFORMIN 850 MG tablet Commonly known as: GLUCOPHAGE TAKE 2 TABLETS BY MOUTH ONCE PER DAY FOR 90 DAYS WITH LARGEST MEAL OF THE DAY   multivitamin  with minerals Tabs tablet Take 1 tablet by mouth daily.   OZEMPIC (0.25 OR 0.5 MG/DOSE) [place] Inject into the skin.        OBJECTIVE:   Vital Signs: BP 118/68 (BP Location: Left Arm, Patient Position: Sitting, Cuff Size: Large)   Pulse 96   Temp 98.6 F (37 C)   Ht 5\' 10"  (1.778 m)   Wt (!) 322 lb 3.2 oz (146.1 kg)   SpO2 97%   BMI 46.23 kg/m   Wt Readings from Last 3 Encounters:  12/04/19 (!) 322 lb 3.2 oz (146.1 kg)  09/02/19 (!) 338 lb 3.2 oz (153.4 kg)  06/03/19 (!) 339 lb 3.2 oz (153.9 kg)     Exam: General: Pt appears well and is in NAD  Neck: General: Supple without adenopathy. Thyroid: Thyroid size normal.  No goiter or nodules appreciated. No thyroid bruit.  Lungs: Clear with good BS bilat with no rales, rhonchi, or wheezes  Heart: RRR with normal S1 and S2 and no gallops; no murmurs; no rub  Abdomen: Normoactive bowel sounds, soft, nontender, without masses or organomegaly palpable  Extremities: No pretibial edema.   Neuro: MS is good with appropriate affect, pt is alert and Ox3      DM foot exam: 12/04/2019  The skin of the feet is intact without sores or ulcerations. The pedal pulses are 2+ on right and 2+ on left. The sensation is intact to a screening 5.07, 10 gram monofilament bilaterally    DATA REVIEWED:  03/16/19 A1c 8.4 % BUn/Cr 18/0.94 GFR 84   ASSESSMENT / PLAN / RECOMMENDATIONS:   1) Type 2 Diabetes Mellitus, Poorly controlled, Without complications - Most recent A1c of 9.1 %. Goal A1c < 7.0%.    -Slight improvement in his A1c, down from 9.7% -This is probably attributed to the recent initiation of Ozempic, we have attempted to prescribe Trulicity in the past but it was cost prohibitive for the patient. -I am going to try and write a prescription for the Ozempic to see if the co-pay is much more affordable. -No changes will be made today, as the patient will increase his Ozempic to 0.5 mg by next week  MEDICATIONS: - Glipizide 10 mg,  Two tablets before Breakfast and Before supper  - Continue Metformin 850 mg Two Tablets daily  - Ozempic 0.5 mg weekly      EDUCATION / INSTRUCTIONS:  BG monitoring instructions: Patient is instructed to check his blood sugars 3 times a day   Call [facility] if: BG persistently < 70 or > 300. . I reviewed the Rule of 15 for the treatment of hypoglycemia in detail with the patient. Literature supplied.  F/U in 3 months    Signed electronically by: Mack Guise, MD  [facility]  [facility] [address] [place], [place] [address] Phone: [phone] FAX: [phone]   CC: [address], [place] [address] Phone: [phone]  Fax: [phone]  Return to Endocrinology clinic as below: No future appointments.

## 2019-12-28 DIAGNOSIS — I340 Nonrheumatic mitral (valve) insufficiency: Secondary | ICD-10-CM | POA: Diagnosis not present

## 2019-12-28 DIAGNOSIS — A419 Sepsis, unspecified organism: Secondary | ICD-10-CM | POA: Diagnosis not present

## 2019-12-28 DIAGNOSIS — R7881 Bacteremia: Secondary | ICD-10-CM | POA: Diagnosis not present

## 2019-12-29 DIAGNOSIS — E785 Hyperlipidemia, unspecified: Secondary | ICD-10-CM

## 2019-12-29 DIAGNOSIS — A419 Sepsis, unspecified organism: Secondary | ICD-10-CM | POA: Diagnosis not present

## 2019-12-29 DIAGNOSIS — R7881 Bacteremia: Secondary | ICD-10-CM | POA: Diagnosis not present

## 2019-12-29 DIAGNOSIS — E119 Type 2 diabetes mellitus without complications: Secondary | ICD-10-CM

## 2019-12-29 DIAGNOSIS — R6510 Systemic inflammatory response syndrome (SIRS) of non-infectious origin without acute organ dysfunction: Secondary | ICD-10-CM | POA: Diagnosis not present

## 2019-12-30 DIAGNOSIS — E785 Hyperlipidemia, unspecified: Secondary | ICD-10-CM | POA: Diagnosis not present

## 2019-12-30 DIAGNOSIS — E119 Type 2 diabetes mellitus without complications: Secondary | ICD-10-CM | POA: Diagnosis not present

## 2019-12-30 DIAGNOSIS — I480 Paroxysmal atrial fibrillation: Secondary | ICD-10-CM

## 2019-12-30 DIAGNOSIS — R6510 Systemic inflammatory response syndrome (SIRS) of non-infectious origin without acute organ dysfunction: Secondary | ICD-10-CM | POA: Diagnosis not present

## 2019-12-30 DIAGNOSIS — A419 Sepsis, unspecified organism: Secondary | ICD-10-CM | POA: Diagnosis not present

## 2019-12-30 DIAGNOSIS — R7881 Bacteremia: Secondary | ICD-10-CM | POA: Diagnosis not present

## 2019-12-31 DIAGNOSIS — E785 Hyperlipidemia, unspecified: Secondary | ICD-10-CM | POA: Diagnosis not present

## 2019-12-31 DIAGNOSIS — I480 Paroxysmal atrial fibrillation: Secondary | ICD-10-CM | POA: Diagnosis not present

## 2019-12-31 DIAGNOSIS — I340 Nonrheumatic mitral (valve) insufficiency: Secondary | ICD-10-CM | POA: Diagnosis not present

## 2019-12-31 DIAGNOSIS — R7881 Bacteremia: Secondary | ICD-10-CM | POA: Diagnosis not present

## 2019-12-31 DIAGNOSIS — R6510 Systemic inflammatory response syndrome (SIRS) of non-infectious origin without acute organ dysfunction: Secondary | ICD-10-CM | POA: Diagnosis not present

## 2019-12-31 DIAGNOSIS — A419 Sepsis, unspecified organism: Secondary | ICD-10-CM | POA: Diagnosis not present

## 2020-01-01 ENCOUNTER — Inpatient Hospital Stay: Admit: 2020-01-01 | Payer: Medicare Other | Admitting: Family Medicine

## 2020-01-01 DIAGNOSIS — D696 Thrombocytopenia, unspecified: Secondary | ICD-10-CM

## 2020-01-01 DIAGNOSIS — E785 Hyperlipidemia, unspecified: Secondary | ICD-10-CM | POA: Diagnosis not present

## 2020-01-01 DIAGNOSIS — R6510 Systemic inflammatory response syndrome (SIRS) of non-infectious origin without acute organ dysfunction: Secondary | ICD-10-CM | POA: Diagnosis not present

## 2020-01-01 DIAGNOSIS — R7881 Bacteremia: Secondary | ICD-10-CM | POA: Diagnosis not present

## 2020-01-01 DIAGNOSIS — A419 Sepsis, unspecified organism: Secondary | ICD-10-CM | POA: Diagnosis not present

## 2020-01-01 DIAGNOSIS — I480 Paroxysmal atrial fibrillation: Secondary | ICD-10-CM | POA: Diagnosis not present

## 2020-01-01 DIAGNOSIS — R945 Abnormal results of liver function studies: Secondary | ICD-10-CM

## 2020-01-02 ENCOUNTER — Inpatient Hospital Stay (HOSPITAL_COMMUNITY): Payer: Medicare Other

## 2020-01-02 ENCOUNTER — Inpatient Hospital Stay (HOSPITAL_COMMUNITY)
Admission: AD | Admit: 2020-01-02 | Discharge: 2020-01-15 | DRG: 870 | Disposition: E | Payer: Medicare Other | Source: Other Acute Inpatient Hospital | Attending: Critical Care Medicine | Admitting: Critical Care Medicine

## 2020-01-02 DIAGNOSIS — Z7984 Long term (current) use of oral hypoglycemic drugs: Secondary | ICD-10-CM

## 2020-01-02 DIAGNOSIS — A4102 Sepsis due to Methicillin resistant Staphylococcus aureus: Principal | ICD-10-CM | POA: Diagnosis present

## 2020-01-02 DIAGNOSIS — M4645 Discitis, unspecified, thoracolumbar region: Secondary | ICD-10-CM | POA: Diagnosis not present

## 2020-01-02 DIAGNOSIS — G9340 Encephalopathy, unspecified: Secondary | ICD-10-CM

## 2020-01-02 DIAGNOSIS — G039 Meningitis, unspecified: Secondary | ICD-10-CM | POA: Diagnosis not present

## 2020-01-02 DIAGNOSIS — Z6841 Body Mass Index (BMI) 40.0 and over, adult: Secondary | ICD-10-CM

## 2020-01-02 DIAGNOSIS — Z515 Encounter for palliative care: Secondary | ICD-10-CM | POA: Diagnosis not present

## 2020-01-02 DIAGNOSIS — E785 Hyperlipidemia, unspecified: Secondary | ICD-10-CM | POA: Diagnosis present

## 2020-01-02 DIAGNOSIS — G003 Staphylococcal meningitis: Secondary | ICD-10-CM | POA: Diagnosis present

## 2020-01-02 DIAGNOSIS — I10 Essential (primary) hypertension: Secondary | ICD-10-CM | POA: Diagnosis present

## 2020-01-02 DIAGNOSIS — M4656 Other infective spondylopathies, lumbar region: Secondary | ICD-10-CM | POA: Diagnosis present

## 2020-01-02 DIAGNOSIS — G8929 Other chronic pain: Secondary | ICD-10-CM | POA: Diagnosis present

## 2020-01-02 DIAGNOSIS — M0029 Other streptococcal polyarthritis: Secondary | ICD-10-CM

## 2020-01-02 DIAGNOSIS — I4891 Unspecified atrial fibrillation: Secondary | ICD-10-CM | POA: Diagnosis present

## 2020-01-02 DIAGNOSIS — Z01818 Encounter for other preprocedural examination: Secondary | ICD-10-CM | POA: Diagnosis not present

## 2020-01-02 DIAGNOSIS — R042 Hemoptysis: Secondary | ICD-10-CM | POA: Diagnosis not present

## 2020-01-02 DIAGNOSIS — Z7982 Long term (current) use of aspirin: Secondary | ICD-10-CM | POA: Diagnosis not present

## 2020-01-02 DIAGNOSIS — J9690 Respiratory failure, unspecified, unspecified whether with hypoxia or hypercapnia: Secondary | ICD-10-CM

## 2020-01-02 DIAGNOSIS — J9601 Acute respiratory failure with hypoxia: Secondary | ICD-10-CM

## 2020-01-02 DIAGNOSIS — A419 Sepsis, unspecified organism: Secondary | ICD-10-CM

## 2020-01-02 DIAGNOSIS — G0481 Other encephalitis and encephalomyelitis: Secondary | ICD-10-CM | POA: Diagnosis present

## 2020-01-02 DIAGNOSIS — E872 Acidosis: Secondary | ICD-10-CM | POA: Diagnosis not present

## 2020-01-02 DIAGNOSIS — Z833 Family history of diabetes mellitus: Secondary | ICD-10-CM | POA: Diagnosis not present

## 2020-01-02 DIAGNOSIS — G0490 Encephalitis and encephalomyelitis, unspecified: Secondary | ICD-10-CM

## 2020-01-02 DIAGNOSIS — J181 Lobar pneumonia, unspecified organism: Secondary | ICD-10-CM | POA: Diagnosis not present

## 2020-01-02 DIAGNOSIS — E1129 Type 2 diabetes mellitus with other diabetic kidney complication: Secondary | ICD-10-CM | POA: Diagnosis not present

## 2020-01-02 DIAGNOSIS — B9562 Methicillin resistant Staphylococcus aureus infection as the cause of diseases classified elsewhere: Secondary | ICD-10-CM | POA: Diagnosis not present

## 2020-01-02 DIAGNOSIS — Z22322 Carrier or suspected carrier of Methicillin resistant Staphylococcus aureus: Secondary | ICD-10-CM | POA: Diagnosis not present

## 2020-01-02 DIAGNOSIS — M0008 Staphylococcal arthritis, vertebrae: Secondary | ICD-10-CM | POA: Diagnosis not present

## 2020-01-02 DIAGNOSIS — E662 Morbid (severe) obesity with alveolar hypoventilation: Secondary | ICD-10-CM | POA: Diagnosis present

## 2020-01-02 DIAGNOSIS — N179 Acute kidney failure, unspecified: Secondary | ICD-10-CM | POA: Diagnosis present

## 2020-01-02 DIAGNOSIS — R7881 Bacteremia: Secondary | ICD-10-CM | POA: Diagnosis present

## 2020-01-02 DIAGNOSIS — R6520 Severe sepsis without septic shock: Secondary | ICD-10-CM | POA: Diagnosis present

## 2020-01-02 DIAGNOSIS — R6510 Systemic inflammatory response syndrome (SIRS) of non-infectious origin without acute organ dysfunction: Secondary | ICD-10-CM | POA: Diagnosis not present

## 2020-01-02 DIAGNOSIS — Z4659 Encounter for fitting and adjustment of other gastrointestinal appliance and device: Secondary | ICD-10-CM

## 2020-01-02 DIAGNOSIS — Z9911 Dependence on respirator [ventilator] status: Secondary | ICD-10-CM | POA: Diagnosis not present

## 2020-01-02 DIAGNOSIS — Z79899 Other long term (current) drug therapy: Secondary | ICD-10-CM

## 2020-01-02 DIAGNOSIS — I5021 Acute systolic (congestive) heart failure: Secondary | ICD-10-CM | POA: Diagnosis not present

## 2020-01-02 DIAGNOSIS — Z791 Long term (current) use of non-steroidal anti-inflammatories (NSAID): Secondary | ICD-10-CM | POA: Diagnosis not present

## 2020-01-02 DIAGNOSIS — G8194 Hemiplegia, unspecified affecting left nondominant side: Secondary | ICD-10-CM | POA: Diagnosis present

## 2020-01-02 DIAGNOSIS — G92 Toxic encephalopathy: Secondary | ICD-10-CM | POA: Diagnosis present

## 2020-01-02 DIAGNOSIS — I4811 Longstanding persistent atrial fibrillation: Secondary | ICD-10-CM | POA: Diagnosis not present

## 2020-01-02 DIAGNOSIS — R7401 Elevation of levels of liver transaminase levels: Secondary | ICD-10-CM | POA: Diagnosis present

## 2020-01-02 DIAGNOSIS — Z66 Do not resuscitate: Secondary | ICD-10-CM | POA: Diagnosis not present

## 2020-01-02 DIAGNOSIS — E1165 Type 2 diabetes mellitus with hyperglycemia: Secondary | ICD-10-CM | POA: Diagnosis present

## 2020-01-02 DIAGNOSIS — J95851 Ventilator associated pneumonia: Secondary | ICD-10-CM | POA: Diagnosis not present

## 2020-01-02 DIAGNOSIS — D6959 Other secondary thrombocytopenia: Secondary | ICD-10-CM | POA: Diagnosis not present

## 2020-01-02 DIAGNOSIS — I480 Paroxysmal atrial fibrillation: Secondary | ICD-10-CM | POA: Diagnosis not present

## 2020-01-02 DIAGNOSIS — G9341 Metabolic encephalopathy: Secondary | ICD-10-CM | POA: Diagnosis not present

## 2020-01-02 DIAGNOSIS — D638 Anemia in other chronic diseases classified elsewhere: Secondary | ICD-10-CM | POA: Diagnosis present

## 2020-01-02 DIAGNOSIS — D696 Thrombocytopenia, unspecified: Secondary | ICD-10-CM | POA: Diagnosis present

## 2020-01-02 LAB — CBC WITH DIFFERENTIAL/PLATELET
Abs Immature Granulocytes: 1.26 10*3/uL — ABNORMAL HIGH (ref 0.00–0.07)
Basophils Absolute: 0.1 10*3/uL (ref 0.0–0.1)
Basophils Relative: 0 %
Eosinophils Absolute: 0.1 10*3/uL (ref 0.0–0.5)
Eosinophils Relative: 0 %
HCT: 35.5 % — ABNORMAL LOW (ref 39.0–52.0)
Hemoglobin: 12.3 g/dL — ABNORMAL LOW (ref 13.0–17.0)
Immature Granulocytes: 5 %
Lymphocytes Relative: 7 %
Lymphs Abs: 1.5 10*3/uL (ref 0.7–4.0)
MCH: 33.4 pg (ref 26.0–34.0)
MCHC: 34.6 g/dL (ref 30.0–36.0)
MCV: 96.5 fL (ref 80.0–100.0)
Monocytes Absolute: 0.9 10*3/uL (ref 0.1–1.0)
Monocytes Relative: 4 %
Neutro Abs: 19.5 10*3/uL — ABNORMAL HIGH (ref 1.7–7.7)
Neutrophils Relative %: 84 %
RBC: 3.68 MIL/uL — ABNORMAL LOW (ref 4.22–5.81)
RDW: 13.6 % (ref 11.5–15.5)
WBC: 23.4 10*3/uL — ABNORMAL HIGH (ref 4.0–10.5)
nRBC: 0.1 % (ref 0.0–0.2)

## 2020-01-02 LAB — BLOOD GAS, ARTERIAL
Acid-base deficit: 2.5 mmol/L — ABNORMAL HIGH (ref 0.0–2.0)
Bicarbonate: 19.9 mmol/L — ABNORMAL LOW (ref 20.0–28.0)
Drawn by: 406621
FIO2: 32
O2 Saturation: 99.3 %
Patient temperature: 37
pCO2 arterial: 24 mmHg — ABNORMAL LOW (ref 32.0–48.0)
pH, Arterial: 7.529 — ABNORMAL HIGH (ref 7.350–7.450)
pO2, Arterial: 129 mmHg — ABNORMAL HIGH (ref 83.0–108.0)

## 2020-01-02 LAB — HEMOGLOBIN A1C
Hgb A1c MFr Bld: 8.5 % — ABNORMAL HIGH (ref 4.8–5.6)
Mean Plasma Glucose: 197.25 mg/dL

## 2020-01-02 LAB — POCT I-STAT 7, (LYTES, BLD GAS, ICA,H+H)
Acid-base deficit: 2 mmol/L (ref 0.0–2.0)
Bicarbonate: 20.1 mmol/L (ref 20.0–28.0)
Calcium, Ion: 1.16 mmol/L (ref 1.15–1.40)
HCT: 37 % — ABNORMAL LOW (ref 39.0–52.0)
Hemoglobin: 12.6 g/dL — ABNORMAL LOW (ref 13.0–17.0)
O2 Saturation: 100 %
Patient temperature: 98.5
Potassium: 3.3 mmol/L — ABNORMAL LOW (ref 3.5–5.1)
Sodium: 143 mmol/L (ref 135–145)
TCO2: 21 mmol/L — ABNORMAL LOW (ref 22–32)
pCO2 arterial: 27.4 mmHg — ABNORMAL LOW (ref 32.0–48.0)
pH, Arterial: 7.474 — ABNORMAL HIGH (ref 7.350–7.450)
pO2, Arterial: 225 mmHg — ABNORMAL HIGH (ref 83.0–108.0)

## 2020-01-02 LAB — COMPREHENSIVE METABOLIC PANEL
ALT: 53 U/L — ABNORMAL HIGH (ref 0–44)
AST: 48 U/L — ABNORMAL HIGH (ref 15–41)
Albumin: 1.8 g/dL — ABNORMAL LOW (ref 3.5–5.0)
Alkaline Phosphatase: 125 U/L (ref 38–126)
Anion gap: 9 (ref 5–15)
BUN: 31 mg/dL — ABNORMAL HIGH (ref 8–23)
CO2: 20 mmol/L — ABNORMAL LOW (ref 22–32)
Calcium: 8 mg/dL — ABNORMAL LOW (ref 8.9–10.3)
Chloride: 112 mmol/L — ABNORMAL HIGH (ref 98–111)
Creatinine, Ser: 0.87 mg/dL (ref 0.61–1.24)
GFR calc Af Amer: >60 mL/min (ref >60)
GFR calc non Af Amer: >60 mL/min (ref >60)
Glucose, Bld: 263 mg/dL — ABNORMAL HIGH (ref 70–99)
Potassium: 3.4 mmol/L — ABNORMAL LOW (ref 3.5–5.1)
Sodium: 141 mmol/L (ref 135–145)
Total Bilirubin: 5.1 mg/dL — ABNORMAL HIGH (ref 0.3–1.2)
Total Protein: 5.6 g/dL — ABNORMAL LOW (ref 6.5–8.1)

## 2020-01-02 LAB — URINALYSIS, COMPLETE (UACMP) WITH MICROSCOPIC
Glucose, UA: >=500 mg/dL — AB
Ketones, ur: NEGATIVE mg/dL
Leukocytes,Ua: NEGATIVE
Nitrite: NEGATIVE
Protein, ur: 100 mg/dL — AB
Specific Gravity, Urine: 1.025 (ref 1.005–1.030)
pH: 5 (ref 5.0–8.0)

## 2020-01-02 LAB — PHOSPHORUS: Phosphorus: 3.2 mg/dL (ref 2.5–4.6)

## 2020-01-02 LAB — HIV ANTIBODY (ROUTINE TESTING W REFLEX): HIV Screen 4th Generation wRfx: NONREACTIVE

## 2020-01-02 LAB — PROTIME-INR
INR: 1.3 — ABNORMAL HIGH (ref 0.8–1.2)
Prothrombin Time: 15.7 seconds — ABNORMAL HIGH (ref 11.4–15.2)

## 2020-01-02 LAB — GLUCOSE, CAPILLARY
Glucose-Capillary: 231 mg/dL — ABNORMAL HIGH (ref 70–99)
Glucose-Capillary: 248 mg/dL — ABNORMAL HIGH (ref 70–99)

## 2020-01-02 LAB — MAGNESIUM: Magnesium: 1.7 mg/dL (ref 1.7–2.4)

## 2020-01-02 LAB — LACTIC ACID, PLASMA
Lactic Acid, Venous: 2.4 mmol/L (ref 0.5–1.9)
Lactic Acid, Venous: 2.4 mmol/L (ref 0.5–1.9)

## 2020-01-02 LAB — BRAIN NATRIURETIC PEPTIDE: B Natriuretic Peptide: 330.4 pg/mL — ABNORMAL HIGH (ref 0.0–100.0)

## 2020-01-02 LAB — PROCALCITONIN: Procalcitonin: 0.86 ng/mL

## 2020-01-02 IMAGING — MR MR HEAD W/O CM
12 of 13 series · 44 of 48 positions shown · non-contrast
Comparison: Head CT [DATE]

CLINICAL DATA: Acute encephalopathy. MRSA bacteremia. Possible
stroke.

EXAM:
MRI HEAD WITHOUT CONTRAST
TECHNIQUE: Multiplanar, multiecho pulse sequences of the brain and surrounding
structures were obtained without intravenous contrast.

[Series 7: DWI · coronal · 4.0mm · 0.88mm/px · 6 of 80 slices shown (1 of 4)]
[im 1/80]
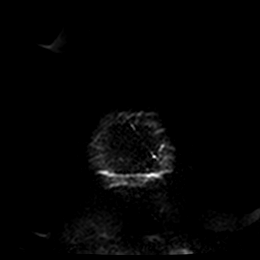
[im 16/80]
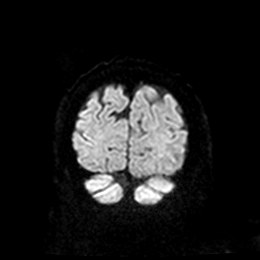
[im 32/80]
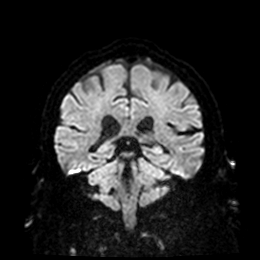
[im 48/80]
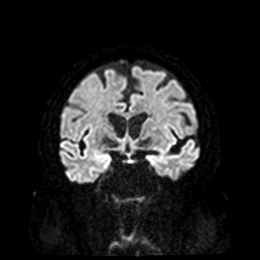
[im 64/80]
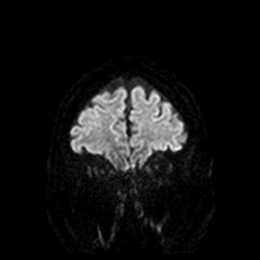
[im 80/80]
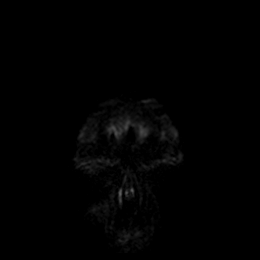

[Series 8: DWI · coronal · 4.0mm · 0.88mm/px · 3 of 40 slices shown (2 of 4)]
[im 1/40]
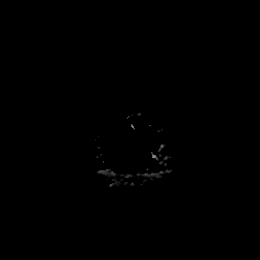
[im 20/40]
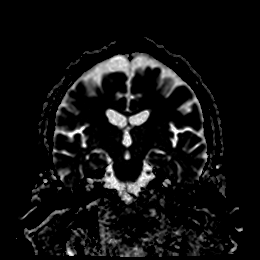
[im 40/40]
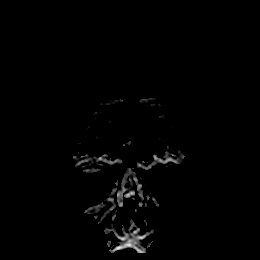

[Series 9: DWI · axial · 3.0mm · 0.88mm/px · z∈[-36,+111]mm · 8 of 104 slices shown (3 of 4)]
[im 1/104]
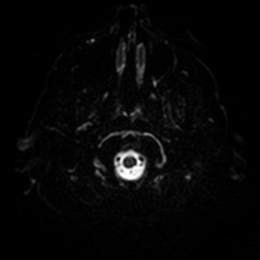
[im 15/104]
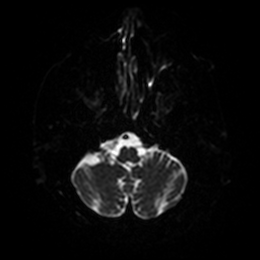
[im 30/104]
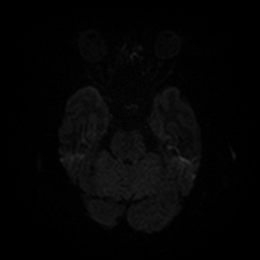
[im 45/104]
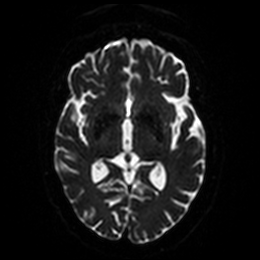
[im 59/104]
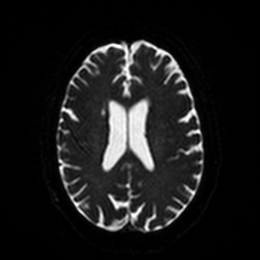
[im 74/104]
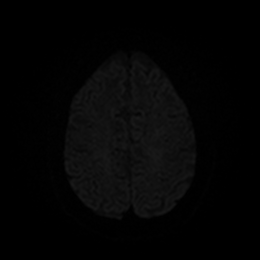
[im 89/104]
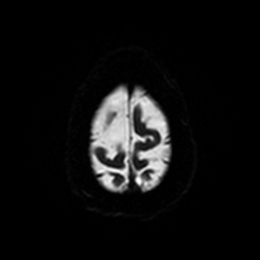
[im 104/104]
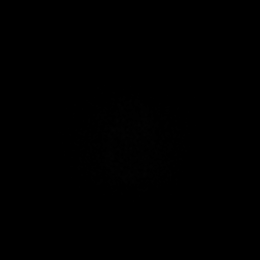

[Series 10: DWI · axial · 3.0mm · 0.88mm/px · z∈[-36,+111]mm · 4 of 51 slices shown (4 of 4)]
[im 1/51]
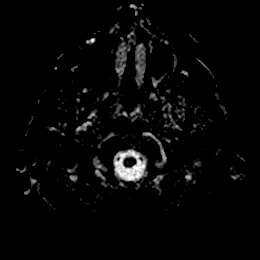
[im 17/51]
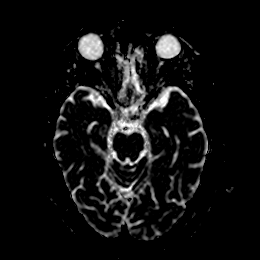
[im 34/51]
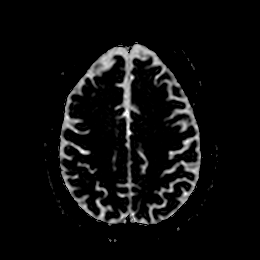
[im 51/51]
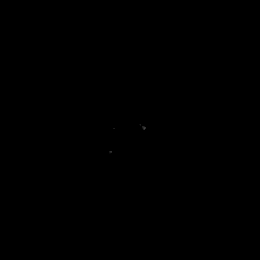

[Series 11: T1 · sagittal · 5.0mm · 0.75mm/px · 2 of 23 slices shown]
[im 1/23]
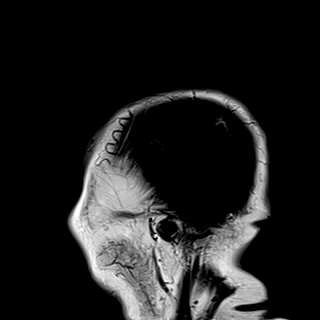
[im 23/23]
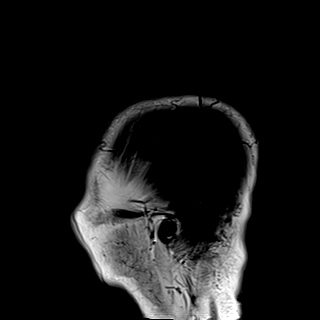

[Series 12: mag_images · axial · 3.0mm · 0.90mm/px · z∈[-41,+106]mm · 4 of 52 slices shown]
[im 1/52]
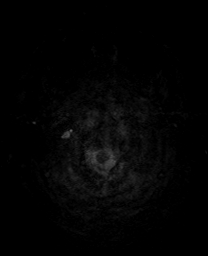
[im 18/52]
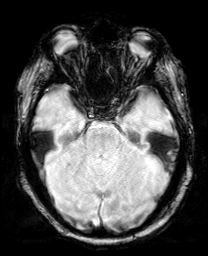
[im 35/52]
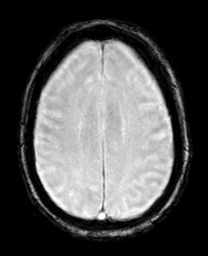
[im 52/52]
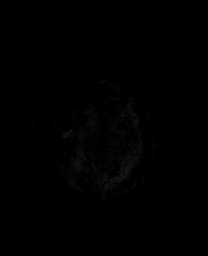

[Series 13: pha_images · axial · 3.0mm · 0.90mm/px · z∈[-41,+106]mm · 4 of 52 slices shown]
[im 1/52]
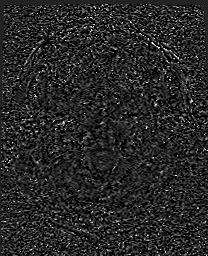
[im 18/52]
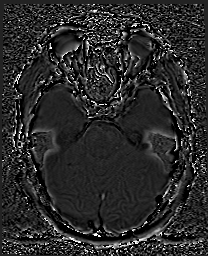
[im 35/52]
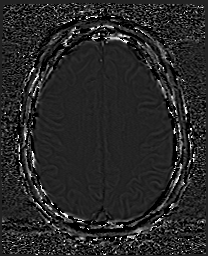
[im 52/52]
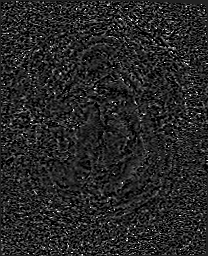

[Series 14: swi_images · axial · 3.0mm · 0.90mm/px · z∈[-41,+106]mm · 4 of 52 slices shown]
[im 1/52]
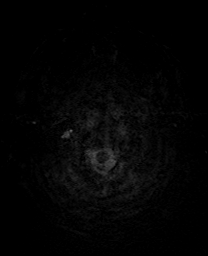
[im 18/52]
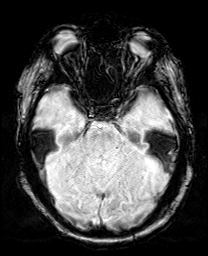
[im 35/52]
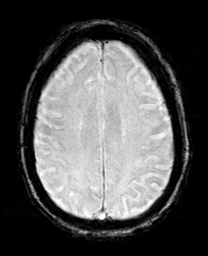
[im 52/52]
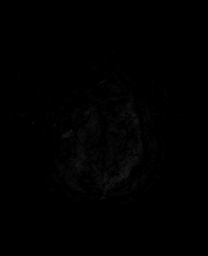

[Series 15: mip_images(sw) · axial · 24.0mm · 0.90mm/px · z∈[-31,+96]mm · 3 of 45 slices shown]
[im 1/45]
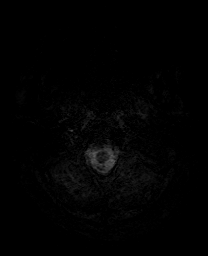
[im 23/45]
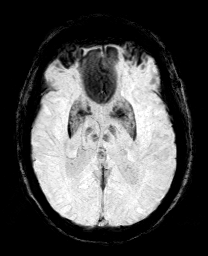
[im 45/45]
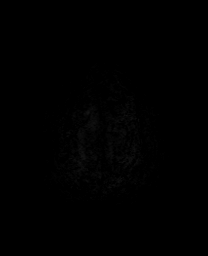

[Series 16: FLAIR · axial · 5.0mm · 0.45mm/px · z∈[-47,+109]mm · 2 of 28 slices shown]
[im 1/28]
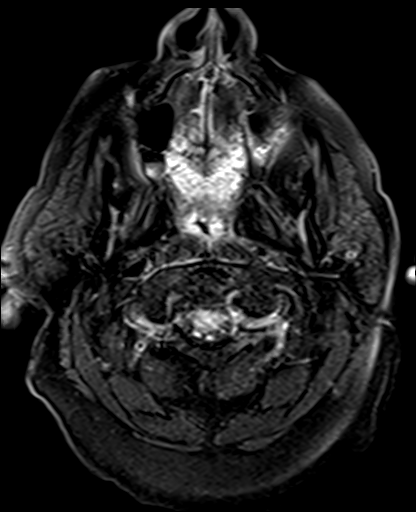
[im 28/28]
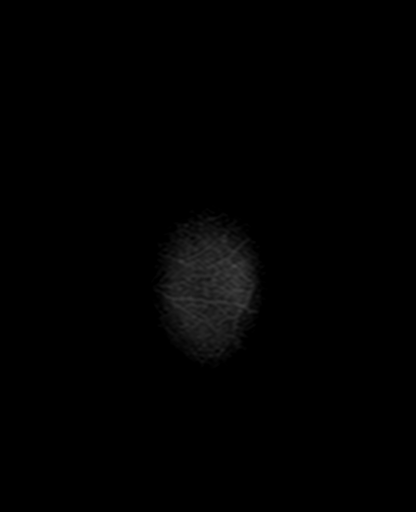

[Series 17: T2 · axial · 5.0mm · 0.90mm/px · z∈[-44,+112]mm · 2 of 28 slices shown (1 of 2)]
[im 1/28]
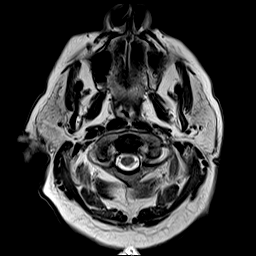
[im 28/28]
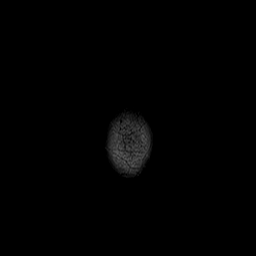

[Series 19: T2 · coronal · 5.0mm · 0.43mm/px · 2 of 33 slices shown (2 of 2)]
[im 1/33]
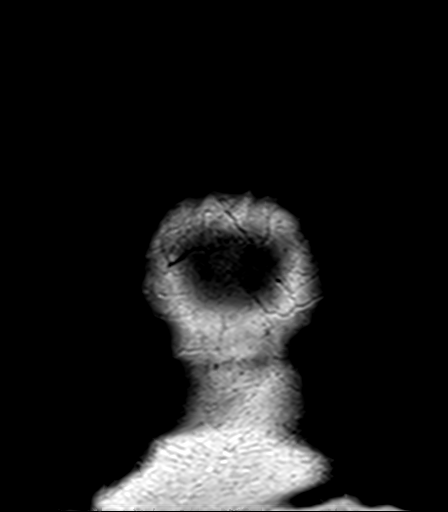
[im 33/33]
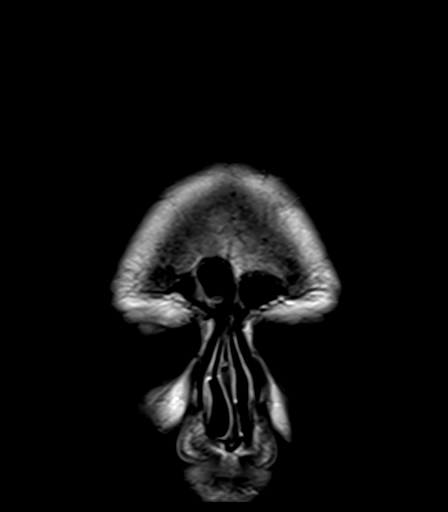

[44 of 48 positions shown; findings below may reference images not displayed]

FINDINGS: Brain: There are punctate foci of trace diffusion weighted signal
hyperintensity along the posterior aspects of the atria/occipital
horns of both lateral ventricles with evidence of trace dependently
layering material in the left lateral ventricle on FLAIR imaging
(series 16, image 13). There is no associated susceptibility
artifact to indicate hemorrhage. There is no periventricular edema.
A few punctate foci of T2 hyperintensity elsewhere in the cerebral
white matter bilaterally are nonspecific but compatible with minimal
chronic small vessel ischemic disease with a chronic lacunar infarct
noted in the right corona radiata. No sizable acute infarct, mass,
midline shift, or extra-axial fluid collection is identified. Mild
cerebral atrophy is within normal limits for age.

Vascular: Major intracranial vascular flow voids are preserved.

Skull and upper cervical spine: Unremarkable bone marrow signal.

Sinuses/Orbits: Unremarkable orbits. Mild left greater than right
ethmoid and left maxillary sinus mucosal thickening. No significant
mastoid fluid.

Other: None.
IMPRESSION: 1. Minimal signal abnormality/debris in the dependent portions of
both lateral ventricles most suggestive of infection with this
history. No periventricular edema or hydrocephalus. Consider lumbar
puncture for further evaluation.
2. No other evidence of acute intracranial abnormality.
3. Minimal chronic small vessel ischemic disease.

## 2020-01-02 IMAGING — DX DG CHEST 1V PORT
1 series · 1 of 1 positions shown · non-contrast
Comparison: [DATE]

CLINICAL DATA: Intubation.

EXAM:
PORTABLE CHEST 1 VIEW

[chest]
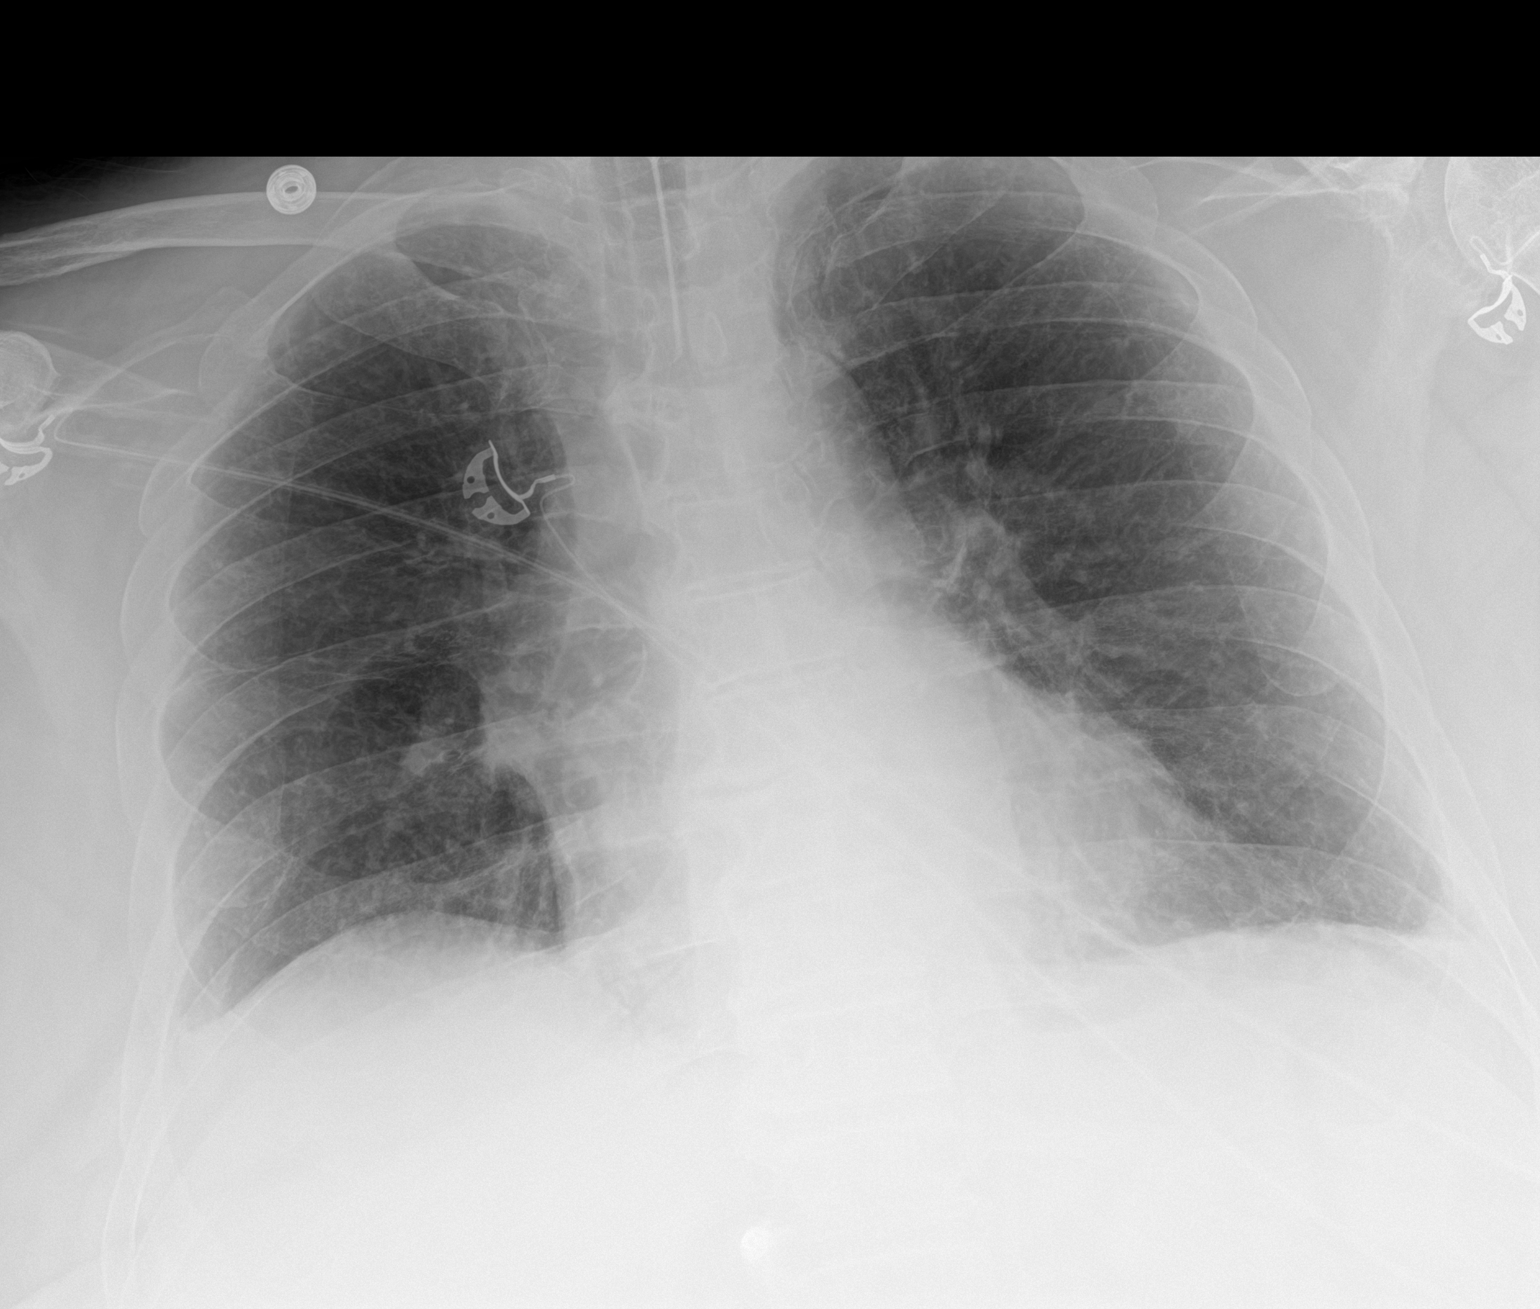

[1 of 1 positions shown; findings below may reference images not displayed]

FINDINGS: An endotracheal tube has been placed and terminates approximately
3.5 cm above the carina. The cardiomediastinal silhouette is
unchanged with normal heart size. The lungs are mildly hypoinflated
without evidence of pulmonary edema, confluent airspace opacity, or
pneumothorax. Trace pleural effusions are questioned.
IMPRESSION: 1. Endotracheal tube as above.
2. Possible trace pleural effusions.

## 2020-01-02 MED ORDER — MIDAZOLAM HCL 2 MG/2ML IJ SOLN
INTRAMUSCULAR | Status: AC
  Filled 2020-01-02: qty 4

## 2020-01-02 MED ORDER — SODIUM CHLORIDE 0.9 % IV BOLUS: 1000.0000 mL | Freq: Once | INTRAVENOUS | Status: AC

## 2020-01-02 MED ORDER — FENTANYL CITRATE (PF) 100 MCG/2ML IJ SOLN: 25.0000 ug | Freq: Once | INTRAMUSCULAR | Status: DC

## 2020-01-02 MED ORDER — DOCUSATE SODIUM 100 MG PO CAPS
100.0000 mg | ORAL_CAPSULE | Freq: Two times a day (BID) | ORAL | Status: DC | PRN
  Filled 2020-01-02: qty 1

## 2020-01-02 MED ORDER — PROPOFOL 1000 MG/100ML IV EMUL
0.0000 ug/kg/min | INTRAVENOUS | Status: DC
  Administered 2020-01-02 (×2): 30 ug/kg/min via INTRAVENOUS
  Administered 2020-01-03 – 2020-01-04 (×9): 40 ug/kg/min via INTRAVENOUS
  Administered 2020-01-04 (×3): 50 ug/kg/min via INTRAVENOUS
  Administered 2020-01-04: 40 ug/kg/min via INTRAVENOUS
  Administered 2020-01-04: 50 ug/kg/min via INTRAVENOUS
  Administered 2020-01-04 (×2): 40 ug/kg/min via INTRAVENOUS
  Administered 2020-01-05 (×2): 50 ug/kg/min via INTRAVENOUS
  Administered 2020-01-05: 10 ug/kg/min via INTRAVENOUS
  Administered 2020-01-05 (×4): 50 ug/kg/min via INTRAVENOUS
  Administered 2020-01-06 (×2): 20 ug/kg/min via INTRAVENOUS
  Administered 2020-01-06: 10 ug/kg/min via INTRAVENOUS
  Administered 2020-01-06 (×2): 20 ug/kg/min via INTRAVENOUS
  Administered 2020-01-07 (×2): 30 ug/kg/min via INTRAVENOUS
  Administered 2020-01-07 (×2): 20 ug/kg/min via INTRAVENOUS
  Administered 2020-01-07 – 2020-01-08 (×5): 30 ug/kg/min via INTRAVENOUS
  Administered 2020-01-08: 20 ug/kg/min via INTRAVENOUS
  Filled 2020-01-02 (×4): qty 100
  Filled 2020-01-02 (×2): qty 200
  Filled 2020-01-02 (×5): qty 100
  Filled 2020-01-02: qty 200
  Filled 2020-01-02 (×8): qty 100
  Filled 2020-01-02: qty 200
  Filled 2020-01-02 (×17): qty 100

## 2020-01-02 MED ORDER — SODIUM CHLORIDE 0.9 % IV SOLN
2.0000 g | Freq: Two times a day (BID) | INTRAVENOUS | Status: DC
  Administered 2020-01-02 – 2020-01-03 (×3): 2 g via INTRAVENOUS
  Filled 2020-01-02 (×3): qty 20

## 2020-01-02 MED ORDER — PROPOFOL 1000 MG/100ML IV EMUL
INTRAVENOUS | Status: AC
  Administered 2020-01-02: 30.047 ug/kg/min via INTRAVENOUS
  Filled 2020-01-02: qty 100

## 2020-01-02 MED ORDER — ROCURONIUM BROMIDE 10 MG/ML (PF) SYRINGE
PREFILLED_SYRINGE | INTRAVENOUS | Status: AC
  Filled 2020-01-02: qty 10

## 2020-01-02 MED ORDER — VANCOMYCIN HCL 2000 MG/400ML IV SOLN
2000.0000 mg | Freq: Once | INTRAVENOUS | Status: AC
  Administered 2020-01-02: 2000 mg via INTRAVENOUS
  Filled 2020-01-02: qty 400

## 2020-01-02 MED ORDER — ETOMIDATE 2 MG/ML IV SOLN
INTRAVENOUS | Status: AC
  Filled 2020-01-02: qty 20

## 2020-01-02 MED ORDER — DOCUSATE SODIUM 50 MG/5ML PO LIQD
100.0000 mg | Freq: Two times a day (BID) | ORAL | Status: DC
  Filled 2020-01-02: qty 10

## 2020-01-02 MED ORDER — AMIODARONE HCL IN DEXTROSE 360-4.14 MG/200ML-% IV SOLN
30.0000 mg/h | INTRAVENOUS | Status: DC
  Administered 2020-01-03 – 2020-01-08 (×14): 30 mg/h via INTRAVENOUS
  Filled 2020-01-02 (×16): qty 200

## 2020-01-02 MED ORDER — SODIUM CHLORIDE 0.9 % IV SOLN
2.0000 g | Freq: Once | INTRAVENOUS | Status: DC
  Filled 2020-01-02: qty 2000

## 2020-01-02 MED ORDER — AMIODARONE HCL IN DEXTROSE 360-4.14 MG/200ML-% IV SOLN
60.0000 mg/h | INTRAVENOUS | Status: AC
  Administered 2020-01-02 (×2): 60 mg/h via INTRAVENOUS
  Filled 2020-01-02 (×3): qty 200

## 2020-01-02 MED ORDER — SODIUM CHLORIDE 0.9 % IV BOLUS
500.0000 mL | Freq: Once | INTRAVENOUS | Status: AC
  Administered 2020-01-02: 500 mL via INTRAVENOUS

## 2020-01-02 MED ORDER — FENTANYL BOLUS VIA INFUSION
25.0000 ug | INTRAVENOUS | Status: DC | PRN
  Administered 2020-01-03 – 2020-01-06 (×5): 25 ug via INTRAVENOUS
  Administered 2020-01-06 (×2): 50 ug via INTRAVENOUS
  Administered 2020-01-06 (×3): 25 ug via INTRAVENOUS
  Administered 2020-01-06 – 2020-01-08 (×2): 50 ug via INTRAVENOUS
  Filled 2020-01-02: qty 25

## 2020-01-02 MED ORDER — INSULIN ASPART 100 UNIT/ML ~~LOC~~ SOLN
0.0000 [IU] | SUBCUTANEOUS | Status: DC
  Administered 2020-01-02 (×2): 5 [IU] via SUBCUTANEOUS
  Administered 2020-01-03: 3 [IU] via SUBCUTANEOUS
  Administered 2020-01-03: 8 [IU] via SUBCUTANEOUS
  Administered 2020-01-03 (×4): 5 [IU] via SUBCUTANEOUS
  Administered 2020-01-04 (×3): 8 [IU] via SUBCUTANEOUS
  Administered 2020-01-04: 15 [IU] via SUBCUTANEOUS
  Administered 2020-01-04 – 2020-01-05 (×4): 8 [IU] via SUBCUTANEOUS
  Administered 2020-01-05: 5 [IU] via SUBCUTANEOUS

## 2020-01-02 MED ORDER — VECURONIUM BROMIDE 10 MG IV SOLR
INTRAVENOUS | Status: AC
  Filled 2020-01-02: qty 10

## 2020-01-02 MED ORDER — PANTOPRAZOLE SODIUM 40 MG IV SOLR
40.0000 mg | Freq: Every day | INTRAVENOUS | Status: DC
  Administered 2020-01-02 – 2020-01-07 (×6): 40 mg via INTRAVENOUS
  Filled 2020-01-02 (×6): qty 40

## 2020-01-02 MED ORDER — SODIUM CHLORIDE 0.9 % IV SOLN
2.0000 g | Freq: Once | INTRAVENOUS | Status: DC
  Filled 2020-01-02: qty 2

## 2020-01-02 MED ORDER — POLYETHYLENE GLYCOL 3350 17 G PO PACK: 17.0000 g | PACK | Freq: Every day | ORAL | Status: DC

## 2020-01-02 MED ORDER — SODIUM CHLORIDE 0.9 % IV SOLN
2.0000 g | INTRAVENOUS | Status: DC
  Administered 2020-01-02 – 2020-01-04 (×9): 2 g via INTRAVENOUS
  Filled 2020-01-02 (×3): qty 2000
  Filled 2020-01-02: qty 2
  Filled 2020-01-02 (×2): qty 2000
  Filled 2020-01-02: qty 2
  Filled 2020-01-02: qty 2000
  Filled 2020-01-02: qty 2
  Filled 2020-01-02: qty 2000
  Filled 2020-01-02: qty 2
  Filled 2020-01-02 (×4): qty 2000

## 2020-01-02 MED ORDER — CHLORHEXIDINE GLUCONATE 0.12% ORAL RINSE (MEDLINE KIT)
15.0000 mL | Freq: Two times a day (BID) | OROMUCOSAL | Status: DC
  Administered 2020-01-02 – 2020-01-08 (×12): 15 mL via OROMUCOSAL

## 2020-01-02 MED ORDER — SUCCINYLCHOLINE CHLORIDE 200 MG/10ML IV SOSY
PREFILLED_SYRINGE | INTRAVENOUS | Status: AC
  Filled 2020-01-02: qty 10

## 2020-01-02 MED ORDER — ORAL CARE MOUTH RINSE
15.0000 mL | OROMUCOSAL | Status: DC
  Administered 2020-01-02 – 2020-01-08 (×58): 15 mL via OROMUCOSAL

## 2020-01-02 MED ORDER — POLYETHYLENE GLYCOL 3350 17 G PO PACK
17.0000 g | PACK | Freq: Every day | ORAL | Status: DC | PRN
  Administered 2020-01-06 – 2020-01-07 (×2): 17 g via ORAL
  Filled 2020-01-02 (×2): qty 1

## 2020-01-02 MED ORDER — FENTANYL 2500MCG IN NS 250ML (10MCG/ML) PREMIX INFUSION
25.0000 ug/h | INTRAVENOUS | Status: DC
  Administered 2020-01-02: 25 ug/h via INTRAVENOUS
  Administered 2020-01-03: 125 ug/h via INTRAVENOUS
  Administered 2020-01-04: 200 ug/h via INTRAVENOUS
  Administered 2020-01-04: 150 ug/h via INTRAVENOUS
  Administered 2020-01-05: 200 ug/h via INTRAVENOUS
  Administered 2020-01-05: 150 ug/h via INTRAVENOUS
  Administered 2020-01-06 – 2020-01-07 (×2): 175 ug/h via INTRAVENOUS
  Administered 2020-01-07: 200 ug/h via INTRAVENOUS
  Administered 2020-01-08: 150 ug/h via INTRAVENOUS
  Administered 2020-01-08: 200 ug/h via INTRAVENOUS
  Administered 2020-01-09 (×2): 400 ug/h via INTRAVENOUS
  Filled 2020-01-02 (×14): qty 250

## 2020-01-02 MED ORDER — PROPOFOL 10 MG/ML IV BOLUS
INTRAVENOUS | Status: AC
  Filled 2020-01-02: qty 20

## 2020-01-02 MED ORDER — AMIODARONE LOAD VIA INFUSION
150.0000 mg | Freq: Once | INTRAVENOUS | Status: DC
  Filled 2020-01-02: qty 83.34

## 2020-01-02 MED ORDER — MORPHINE SULFATE (PF) 2 MG/ML IV SOLN
INTRAVENOUS | Status: AC
  Administered 2020-01-02: 2 mg via INTRAVENOUS
  Filled 2020-01-02: qty 1

## 2020-01-02 MED ORDER — STERILE WATER FOR INJECTION IJ SOLN
INTRAMUSCULAR | Status: AC
  Filled 2020-01-02: qty 10

## 2020-01-02 MED ORDER — FENTANYL CITRATE (PF) 100 MCG/2ML IJ SOLN
INTRAMUSCULAR | Status: AC
  Filled 2020-01-02: qty 2

## 2020-01-02 MED ORDER — VANCOMYCIN HCL 2000 MG/400ML IV SOLN
2000.0000 mg | Freq: Two times a day (BID) | INTRAVENOUS | Status: DC
  Administered 2020-01-03 (×2): 2000 mg via INTRAVENOUS
  Filled 2020-01-02 (×5): qty 400

## 2020-01-02 NOTE — Consult Note (Signed)
Neurology Consultation  Reason for Consult: AMS, code stroke Referring Physician: Dr. Tamala Julian, PCCM  CC: Code stroke for altered mental status and left-sided weakness  History is obtained from: Paper chart review from [place]  HPI: Isaac Hamilton is a 69 y.o. male past medical history of diabetes, dyslipidemia, hypertension, obesity, admitted to [facility] since the past few days for treatment of MRSA bacteremia, with progressive deterioration in his mentation-with reference to his mentation being agitated, drowsy, not following commands over the past few days, transferred for higher level of care at [facility], noted to be difficult to protect his airway as well as not moving his left side as much as the right side on examination upon arrival from [place]. The family reports that he was taken to [facility] when he started complaining of mid to low back pain on Sunday following helping his girlfriend move some heavy object.  He had multiple complaints and was evaluated with a battery of tests and noted to have MRSA bacteremia.  In spite of treatment of that, his mental status kept on worsening. In addition to MRSA bacteremia, noted to have transaminitis, AKI, thrombocytopenia. Noted to have new onset atrial fibrillation at that hospital. Today, on arrival here, he was noted to be altered and lethargic, not protecting his airway and not moving his left side as much and a code stroke was called with an unknown last known normal. The patient has not been in his normal mentation at least for 2 days if not more based on the history provided by the 2 sons as well as chart review. He was being actively bagged and getting ready to be intubated with a code stroke was called and I was able to get a brief examination prior to him being intubated.  Patient had been seen by telemedicine neurology where there is a suspicion for acute stroke due to new onset A. fib in the setting of septicemia as  well.  There was also concern for possible endocarditis as well as meningitis given all deranged inflammatory and infectious markers. Patient was unable to get a spinal tap due to his body habitus yet and was scheduled for a fluoroscopic guided tap but that did not happen and he was transferred here. He could not get an MRI as recommended by telemedicine neurology due to his body habitus and the machine at that hospital not be able to for this patient.  Most of the history and timing of last known normal was obtained after the code stroke was activated by the floor nurses and rapid response due to the acuity of the situation.  Code stroke was eventually canceled due to last known normal being way outside the window for any intervention   LKW: At least 2 days ago tpa given?: no, outside the window Premorbid modified Rankin scale (mRS): 3 ROS: Unable to perform due to altered mental status   Past Medical History:  Diagnosis Date  . Diabetes mellitus ([place])   . Dyslipidemia     Family History  Problem Relation Age of Onset  . Diabetes Mother   . Diabetes Sister     Social History:   reports that he has never smoked. He has never used smokeless tobacco. He reports that he does not drink alcohol. No history on file for drug use.  Medications  Current Facility-Administered Medications:  .  docusate sodium (COLACE) capsule 100 mg, 100 mg, Oral, BID PRN, Merlene Laughter F, NP .  etomidate (AMIDATE) 2 MG/ML  injection, , , ,  .  fentaNYL (SUBLIMAZE) 100 MCG/2ML injection, , , ,  .  midazolam (VERSED) 2 MG/2ML injection, , , ,  .  pantoprazole (PROTONIX) injection 40 mg, 40 mg, Intravenous, QHS, Davis, Whitney F, NP .  polyethylene glycol (MIRALAX / GLYCOLAX) packet 17 g, 17 g, Oral, Daily PRN, Raymon Mutton F, NP .  propofol (DIPRIVAN) 10 mg/mL bolus/IV push, , , ,  .  rocuronium bromide 100 MG/10ML SOSY, , , ,  .  sterile water (preservative free) injection, , , ,  .  succinylcholine  (ANECTINE) 200 MG/10ML syringe, , , ,  .  vecuronium (NORCURON) 10 MG injection, , , ,    Exam: Current vital signs: BP 128/75   Pulse (!) 129   Temp 98.5 F (36.9 C) (Oral)   Resp (!) 23   Ht 5\' 10"  (1.778 m)   SpO2 99%   BMI 46.23 kg/m  Vital signs in last 24 hours: Temp:  [98.5 F (36.9 C)] 98.5 F (36.9 C) (06/18 1513) Pulse Rate:  [78-129] 129 (06/18 1513) Resp:  [20-23] 23 (06/18 1513) BP: (128)/(75) 128/75 (06/18 1513) SpO2:  [99 %] 99 % (06/18 1513) General: Extremely lethargic, being bagged at this point, getting ready to be intubated. HEENT: Normocephalic atraumatic CVS: Tachycardic, irregularly irregular Respiratory: Bag mask ventilated-about to be intubated. Extremities: Trace edema Neurological exam Extremity lethargic, does not follow any commands. Nonverbal Pupils equal round reactive to light No gaze preference or deviation Difficult ascertain facial asymmetry. Not moving any extremities spontaneously but to noxious stimulation actively withdraws right upper and right lower and is weaker to withdraw left upper and lower extremity with the left upper extremity being nearly flaccid. Sensory examination also as above.   Labs I have reviewed labs in paper chart and the results pertinent to this consultation are: Had leukocytosis which is resolved.  Continues to have elevated inflammatory markers.  Continues to have elevated procalcitonin.  Imaging I have reviewed the images obtained: CT-scan of the brain at [place]-no acute changes. MRI brain stat done at [facility] that does not show any evidence of acute stroke.  Assessment:  69 year old man with multiple cardiovascular risk factors, transferred from [facility] with multiple issues including MRSA bacteremia, new onset atrial fibrillation with RVR, deranged liver and kidney functions, noted to be extremely lethargic and weaker on the left side for which a code stroke was activated. Family reports  him being altered in mentation for the past 2 days if not more. Outside the window for any stroke intervention at this time Examination did reveal left hemiparesis to noxious stimulation-he was not very cooperative with exam as he was in acute respiratory distress and was being bag mask ventilated to be intubated at that time. Due to the concern for underlying sepsis and concern for stroke on clinical examination-I recommended that he be taken for stat MRI brain to rule out an acute stroke. Preliminary review of the MRI brain is negative for stroke. Other differentials to consider at this time: -Toxic metabolic encephalopathy -CNS infection cannot be ruled out-if there was concern for CNS infection an LP was recommended-I think it should be still performed. -Stroke less likely -Rule out seizure-based on the clinical exam also less likely.  Other active issues: -New onset atrial fibrillation with RVR. -Acute respiratory failure -Transaminitis -AKI -Thrombocytopenia  Recommendations: -Broad antibiotic coverage to include meningitis coverage-vancomycin/ceftriaxone/ampicillin.  Appreciate pharmacy consult. -I do not suspect HSV encephalitis in the setting of sepsis so  I do not think acyclovir is absolutely necessary at this time. -Requested fluoroscopy guided LP-order in the chart. -EEG -CBC BMP, ammonia, B12, TSH. -Check chest x-ray, UA. -Management of toxic metabolic derangements per primary team as you are. -Ventilatory support and ventilator management per PCCM as you are. -Management of A. fib with RVR per primary team as you are.   -- Milon Dikes, MD [facility] Pager: [phone] If 7pm to 7am, please call on call as listed on AMION.  CRITICAL CARE ATTESTATION Performed by: Milon Dikes, MD Total critical care time: 65 minutes Critical care time was exclusive of separately billable procedures and treating other patients and/or supervising  APPs/Residents/Students Critical care was necessary to treat or prevent imminent or life-threatening deterioration due to toxic metabolic encephalopathy. This patient is critically ill and at significant risk for neurological worsening and/or death and care requires constant monitoring. Critical care was time spent personally by me on the following activities: development of treatment plan with patient and/or surrogate as well as nursing, discussions with consultants, evaluation of patient's response to treatment, examination of patient, obtaining history from patient or surrogate, ordering and performing treatments and interventions, ordering and review of laboratory studies, ordering and review of radiographic studies, pulse oximetry, re-evaluation of patient's condition, participation in multidisciplinary rounds and medical decision making of high complexity in the care of this patient.

## 2020-01-02 NOTE — H&P (Addendum)
NAME:  Isaac Hamilton, MRN:  [id number], DOB:  1951/02/17, LOS: 0 ADMISSION DATE:  01/07/2020, CONSULTATION DATE:  12/25/2019 REFERRING MD:  Lorin Mercy - TRH, CHIEF COMPLAINT:  Low back pain, fatigue , AMS + MRSA Bacteremia  Brief History   69 yo M transferred to [facility] to [facility] service 6/18. Upon arrival, unresponsive. PCCM consulted for evaluation. Neurology consulted as code stroke   History of present illness   69 yo M PMH DM, Afib, hx MRSA who transferred to [facility] from [place] with reported MRSA septicemia and inability to obtain MRI due to imaging; admitted to [facility] service, reportedly AOOx4 prior to transfer. Admitted to [facility] for possible back strain in setting of helping sig other move a scooter. Found to have MRSA bacteremia, and worsening malaise, fatigue. Hospitalization at [facility] complicated by AKI, thrombocytopenia, transaminitis, toxic encephalopathy. TEE not pursued due to thrombocytopenia.  Arrives to [facility] on dilt gtt, and unresponsive with L sided weakness. Concern that patient is not protecting airway; PCCM consulted and neurology paged for "code stroke."   PCCM and neurology at bedside with rapid response and RRT.    Past Medical History  DM2 Afib Chronic back pain HLD OA HTN Morbid obesity   Significant Hospital Events   6/18 arrives to [facility], unresponsive.   Consults:  Neurology ID PCCM  Procedures:  6/18 ETT>   Significant Diagnostic Tests:  6/18 CXR >>   Micro Data:  Known MRSA bacteremia   Antimicrobials:  Vanc 6/18 >>  Interim history/subjective:  Poorly responsive with L sided weakness. Not protecting airway. Being seen by PCCM and neurology at bedside   Objective   Blood pressure 128/75, pulse (!) 129, temperature 98.5 F (36.9 C), temperature source Oral, resp. rate (!) 23, SpO2 99 %.       No intake or output data in the 24 hours ending 12/19/2019 1550 There were no vitals filed for this visit.  Examination: General: Morbidly obese chronically and  critically ill Older adult M, intubated NAD HENT: NCAT. Redundant neck tissue. ETT secure.  Lungs: Diminished bibasilar sounds. Bilateral rhonchi.  Cardiovascular: irir. s1s2 no rgm. Cap refill 3 seconds  Abdomen: Obese, soft, ndnt  Extremities: Edematous. No obvious joint deformity. No clubbing  Neuro: Flicker to pain L, R purposeful movement. Moans to tactile stimuli. PERRL GU: foley   Resolved Hospital Problem list     Assessment & Plan:   Acute respiratory failure in setting of encephalopathy and poor airway protection, requiring intubation P -STAT CXR -ABG 1 hr -VAP, PAD   Acute Encephalopathy  -suspect predominantly toxic metabolic encephalopathy in setting of severe sepsis due to MRSA bacteremia Possible CVA -- Code stoke initially called upon arrival to [facility].  -- LKN 6/16 at [place] [facility]. Progressively worse, Not moving L side 6/18 P -neurology consulted -MRI STAT to eval for possible CVA  -Bacteremia as below -Check CBC, CMP   MRSA Bacteremia with severe sepsis  P -ID consult in AM (please consult 6/19) -Vanc  -If MRI remarkable for CVA, consider possible endocarditis  -trend CBC, fever curve   Afib P -Awaiting MRI brain and repeat CBC (thrombocytopenic at OSH) to determine anticoagulation trajectory -SCD in interim  -Dilt gtt  -Mag goal >2 K goal > 4  HTN P -ICU monitoring -dilt gtt as above  AKI P -Trend renal function, electrolytes   DM2 P -SSI  Best practice:  Diet: nPO  Pain/Anxiety/Delirium protocol (if indicated): Prop, fent  VAP protocol (if indicated): yes DVT prophylaxis: SCD GI  prophylaxis: protonix  Glucose control: SSI  Mobility: BR Code Status: Full  Family Communication: Family updated by [facility] 6/18 Disposition: to ICU   Labs   CBC: No results for input(s): WBC, NEUTROABS, HGB, HCT, MCV, PLT in the last 168 hours.  Basic Metabolic Panel: No results for input(s): NA, K, CL, CO2, GLUCOSE, BUN, CREATININE, CALCIUM, MG, PHOS in  the last 168 hours. GFR: CrCl cannot be calculated (No successful lab value found.). No results for input(s): PROCALCITON, WBC, LATICACIDVEN in the last 168 hours.  Liver Function Tests: No results for input(s): AST, ALT, ALKPHOS, BILITOT, PROT, ALBUMIN in the last 168 hours. No results for input(s): LIPASE, AMYLASE in the last 168 hours. No results for input(s): AMMONIA in the last 168 hours.  ABG No results found for: PHART, PCO2ART, PO2ART, HCO3, TCO2, ACIDBASEDEF, O2SAT   Coagulation Profile: No results for input(s): INR, PROTIME in the last 168 hours.  Cardiac Enzymes: No results for input(s): CKTOTAL, CKMB, CKMBINDEX, TROPONINI in the last 168 hours.  HbA1C: Hemoglobin A1C  Date/Time Value Ref Range Status  12/04/2019 11:02 AM 9.1 (A) 4.0 - 5.6 % Final  09/02/2019 11:06 AM 9.7 (A) 4.0 - 5.6 % Final    CBG: Recent Labs  Lab 01/14/2020 1521  GLUCAP 231*    Review of Systems:   Unable to obtain due to encephalopathy  Intubated and sedated   Past Medical History  He,  has a past medical history of Diabetes mellitus ([place]) and Dyslipidemia.   Surgical History    Past Surgical History:  Procedure Laterality Date  . REPLACEMENT TOTAL KNEE Left      Social History   reports that he has never smoked. He has never used smokeless tobacco. He reports that he does not drink alcohol.   Family History   His family history includes Diabetes in his mother and sister.   Allergies No Known Allergies   Home Medications  Prior to Admission medications   Medication Sig Start Date End Date Taking? Authorizing Provider  aspirin 81 MG chewable tablet Chew by mouth daily.    [provider]  atorvastatin (LIPITOR) 10 MG tablet TAKE 1 TABLET BY MOUTH ONCE PER DAY FOR 90 DAYS AT NIGHT (BLOOD WORK 90 DAYS) 12/06/18   [provider]  citalopram (CELEXA) 40 MG tablet Take 40 mg by mouth daily. 11/18/18   [provider]  Continuous Blood Gluc Sensor  (FREESTYLE LIBRE 14 DAY SENSOR) MISC Inject 1 Device into the skin as directed. USE AS DIRECTED E11.65 12/04/19   Shamleffer, Melanie Crazier, MD  diclofenac (VOLTAREN) 75 MG EC tablet TAKE 1 TABLET BY MOUTH 2 TIMES PER DAY FOR 90 DAYS 11/18/18   [provider]  glipiZIDE (GLUCOTROL) 10 MG tablet TAKE 2 TABLETS (20 MG TOTAL) BY MOUTH 2 (TWO) TIMES DAILY BEFORE A MEAL. 11/25/19   Shamleffer, Melanie Crazier, MD  loratadine (CLARITIN) 10 MG tablet Take 10 mg by mouth daily.    [provider]  meloxicam (MOBIC) 7.5 MG tablet Take 7.5 mg by mouth daily. 01/30/19   [provider]  metFORMIN (GLUCOPHAGE) 850 MG tablet TAKE 2 TABLETS BY MOUTH ONCE PER DAY FOR 90 DAYS WITH LARGEST MEAL OF THE DAY 11/26/19   Shamleffer, Melanie Crazier, MD  Multiple Vitamin (MULTIVITAMIN WITH MINERALS) TABS tablet Take 1 tablet by mouth daily.    [provider]  Semaglutide,0.25 or 0.5MG/DOS, (OZEMPIC, 0.25 OR 0.5 MG/DOSE,) 2 MG/1.5ML SOPN Inject 0.5 mg into the skin once a  week. 12/04/19   Shamleffer, Melanie Crazier, MD     Critical care time: 87mn    CRITICAL CARE Performed by: GCristal Generous  Total critical care time: 50 minutes  Critical care time was exclusive of separately billable procedures and treating other patients.  Critical care was necessary to treat or prevent imminent or life-threatening deterioration.  Critical care was time spent personally by me on the following activities: development of treatment plan with patient and/or surrogate as well as nursing, discussions with consultants, evaluation of patient's response to treatment, examination of patient, obtaining history from patient or surrogate, ordering and performing treatments and interventions, ordering and review of laboratory studies, ordering and review of radiographic studies, pulse oximetry and re-evaluation of patient's condition.   GEliseo GumMSN, AGACNP-BC [phone] no answer, [phone]/27/2021, 3:50 PM    PCCM:    69yo, transfer from [place], AHRF, AMS, acute metabolic encephalopathy, MRSA bacteremia, [place] and decision was made for intubation and transfer to the ICU. Initial complaints of LBP. MRI unable to be complete due to size at [place]   PMH DM2, obese, afib, HTN  BP 128/75   Pulse (!) 129   Temp 98.5 F (36.9 C) (Oral)   Resp (!) 23   Ht _0  (1.778 m)   SpO2 99%   BMI 46.23 kg/m  Gen: obese male HENT: not tracking Neuro: flinch to left pain, moving right, moaning Heart: irregular, s1 s2 Lungs: diminished bilaterally   Labs: from [facility] record reviewed  Scr 2.7  procal 19  ESR 99   A: MRSA Bacteremia  Sepsis secondary to above  Unclear source but with LBP need to r/o abscess  Neuro changes, Left hemiparesis, neuro concerned for possible stroke vs metabolic reason  AHRF, obese, likely OHS baseline  Afib  HTN  AKI, unclear baseline Scr  DM2  P: MRI brain  Neuro consulted  ?need for LP Needs spinal imaging I presume. Will leave this decision to neuro as well Continue vancomycin for MRSA Repeat Bcx to document sterilty or see if has persistent bacteremia  Wean vent as tolerated, peep and fio2 Repeat cultures Bcx, ucx, sputum  vanco + cefepime  Get cx records from [place] Vent protocol   This patient is critically ill with multiple organ system failure; which, requires frequent high complexity decision making, assessment, support, evaluation, and titration of therapies. This was completed through the application of advanced monitoring technologies and extensive interpretation of multiple databases. During this encounter critical care time was devoted to patient care services described in this note for 32 minutes.  BGarner Nash DO LTchulaPulmonary Critical Care 01/01/2020 5:11 PM

## 2020-01-02 NOTE — Code Documentation (Signed)
Code stroke called at 1548. Pt was recently transferred from [facility]. Upon arrival to [facility] 3 E, pt was poorly responsive and moving Left side less than Right. CCM was called to intubate pt, who then requested that a code stroke be called. Right before intubation, pt is moaning and moving rt arm spontaneously. Upon talking with the pt's family, Dr Wilford Corner discovered that the last known well was 2 days ago. Pt intubated, taken for MRI at 1404. Code stroke cancelled per Dr Bess Harvest direction as pt is out of window for either TPA or Radiological Intervention should he be found to have had a stroke.

## 2020-01-02 NOTE — Progress Notes (Addendum)
Pt arrived to unit. Not responding to speech. Diltiazem gtt goin at 61ml/hr.normal saline at 62ml/hr. Indwelling foley in place.  Nurse called rapid response nurse.  EKG completed.  BG 231.  HR 155 then down to 130 sustained.  MD paged to make aware.

## 2020-01-02 NOTE — Procedures (Signed)
Intubation Procedure Note Isaac Hamilton [id number] April 25, 1951  Procedure: Intubation Indications: Airway protection and maintenance  Procedure Details Consent: Risks of procedure as well as the alternatives and risks of each were explained to the (patient/caregiver).  Consent for procedure obtained. Time Out: Verified patient identification, verified procedure, site/side was marked, verified correct patient position, special equipment/implants available, medications/allergies/relevent history reviewed, required imaging and test results available.  Performed  Maximum sterile technique was used including gloves, hand hygiene and mask.  Glidescope 4 used to directly visualized vocal cords, significant thick black secretions seen in back of airway partially obscuring view. Suction used to help with visualization   Evaluation Hemodynamic Status: BP stable throughout; O2 sats: stable throughout Patient's Current Condition: stable Complications: No apparent complications Patient did tolerate procedure well. Chest X-ray ordered to verify placement.  CXR: pending.  Delfin Gant, NP-C [facility] Contact / Pager information can be found on Amion  Jan 09, 2020, 4:24 PM

## 2020-01-02 NOTE — Progress Notes (Signed)
RT transported Pt. On vent to MRI then to 2M16

## 2020-01-02 NOTE — Progress Notes (Signed)
Called to bedside to assess pt for hypotension.  Recent SBPs in 80, however following 1L IVF, SBP > 100. Patient had been continued on dilt gtt for atrial fibrillation, present from transfer at [facility].   Atrial Fib Borderline Hypotension P -wean sedation as able -IVF -Will try amiodarone -- dc dilt gtt.  -Still awaiting admit labs to eval plt     Tessie Fass MSN, AGACNP-BC [facility] Pulmonary/Critical Care Medicine [phone] If no answer, [phone] 12/29/2019, 6:14 PM

## 2020-01-02 NOTE — Progress Notes (Addendum)
I was called for admission from this patient who arrived in transfer from [facility].  Immediately after I was notified of admission, I was notified that the patient was unresponsive and that RRT was being called.  I went to evaluate the patient and indeed found him unresponsive.  I spoke to his sons and he has had progressively decreased LOC since admission Sunday night at [facility].  Upon being touched, the patient had profound tachypnea.  Based on his instability upon arrival, PCCM was immediately consulted.  They came to see the patient quickly and agreed that he was appropriate for ICU transfer.  Family agreed with the transfer and acknowledged that he is full code.  Care transferred accordingly.  TRH will be happy to assume care once the patient is stabilized and no longer requiring ICU level of care.  I spent 45 minutes evaluating the patient and coordinating transfer of care.   Total critical care time: 45 minutes Critical care time was exclusive of separately billable procedures and treating other patients. Critical care was necessary to treat or prevent imminent or life-threatening deterioration. Critical care was time spent personally by me on the following activities: development of treatment plan with patient and/or surrogate as well as nursing, discussions with consultants, evaluation of patient's response to treatment, examination of patient, obtaining history from patient or surrogate, ordering and performing treatments and interventions, ordering and review of laboratory studies, ordering and review of radiographic studies, pulse oximetry and re-evaluation of patient's condition.   Georgana Curio, M.D.

## 2020-01-02 NOTE — Progress Notes (Addendum)
Pharmacy Antibiotic Note  Isaac Hamilton is a 69 y.o. male transferred to [facility] from [facility] on 2020/01/31 with MRSA bacteremia and mental status changes, with concern for meningitis and endocarditis. Hospitalization at [facility] was complicated by thrombocytopenia; TEE not pursed there due to thrombocytopenia. LP not able to be done at OSH, due to pt's body habitus at OSH. Pharmacy has been consulted for vancomycin dosing for bacteremia/meningitis, and ceftriaxone/ampicillin for meningitis.  Per the pharmacist at [facility], pt has been receiving vancomycin there from 6/14 until time of transfer (originally rec'd 1500 mg IV Q 12 hrs, with steady-state vanc peak/trough levels of 17.3/11.1 and vancomycin AUC 362 yesterday, so regimen was increased to 2 gm IV Q 12 hrs, with last dose given at 0547 AM today). Scr today 0.7, renal function stable at OSH. Pt was also receiving Zosyn 4.5 gm IV Q 6 hrs at OSH (last dose at 0937 AM today).   Cultures at OSH (per pharmacist): 6/13 Bld cx: MRSA  6/15 Bld cx: GPC in clusters 6/17 Bld cx: GPC in clusters  Scr on admission here was 0.87; CrCl ~116 ml/min; WBC on admission: 23.4  Plan: Continue vancomycin 2 gm IV Q 12 hrs (goal vancomycin trough level: upper end of 15-20 mg/L range); check vancomycin trough level prior to fourth dose Ceftriaxone 2 gm IV Q 12 hrs Ampicillin 2 gm IV Q 4 hrs Monitor WBC, temp, clinical improvement, cultures, renal function, vancomycin levels as indicated  Height: 5\' 10"  (177.8 cm) Weight: (!) 142 kg (313 lb 0.9 oz) IBW/kg (Calculated) : 73  Temp (24hrs), Avg:98.5 F (36.9 C), Min:98.5 F (36.9 C), Max:98.5 F (36.9 C)  Recent Labs  Lab 01-31-2020 1800  WBC 23.4*  CREATININE 0.87    Estimated Creatinine Clearance: 115.6 mL/min (by C-G formula based on SCr of 0.87 mg/dL).    No Known Allergies  Antimicrobials this admission: 6/18 vancomycin >> 6/18 ceftriaxone >> 6/18 ampicillin  >>  Microbiology results: See above for cx at OSH  Thank you for allowing pharmacy to be a part of this patient's care.  7/18, PharmD, BCPS, [facility] Clinical Pharmacist 01/31/2020 4:50 PM

## 2020-01-02 NOTE — Progress Notes (Signed)
Stat ABG collected. Arlys John in lab notified.

## 2020-01-02 NOTE — Significant Event (Signed)
Rapid Response Event Note  Overview: Neurologic - Decreased LOC Respiratory - Increased WOB/RR  Initial Focused Assessment: Called by nurse with concerns of patient not responding and waking up. Patient just arrived from OSH and was not responsive. When I arrived was minimally responsive - did localize to pain on the RUE/RLE but did not on LUE/LLE as well - LT side appeared much weaker. + GAG and BL CR. Patient was tachypneic RR 30s and with stimulation his RR would increased into the 50s - very rapid shallow breathing - lung sounds: minimally air movement bilaterally. Pupils 4 mm brisk/reactive and equal. HR 160s - AF on Cardizem 15 mg/hr - BP 128/75(87).   TRH MD came to the bedside. PCCM was consulted. PCCM MD came to the bedside and decisions made by MD to activate a Code Stroke and intubate the patient.   Interventions: -- STAT ABG -- Code Stroke called 1542 - Per Dr. Katrinka Blazing  -- RSI at 1555 -- successfully intubated at 1557  -- Taken for STAT MRI per request of Neuro MD and PCCM MD -- Received a total of Etomidate 20 mg IV, Roc 100 mg, Versed 4 mg, Fentanyl 100 mcg IV  Plan of Care: -- Transferred to MRI - while in MRI - HR remained in the 160-170s - AF on Cardizem 15 mg/hr. BP remained stable after I initiated a 500 cc LR bolus. Post intubation SBP were in the 80s so I started fluids prior to restarting the Cardizem 15 mg/hr. Patient was then transferred to 2M16.   Event Summary:  Call Time 1518 Arrival Time 1520 End Time 1740  Isaac Hamilton R

## 2020-01-03 ENCOUNTER — Inpatient Hospital Stay (HOSPITAL_COMMUNITY): Payer: Medicare Other

## 2020-01-03 DIAGNOSIS — I5021 Acute systolic (congestive) heart failure: Secondary | ICD-10-CM

## 2020-01-03 LAB — BASIC METABOLIC PANEL
Anion gap: 12 (ref 5–15)
BUN: 40 mg/dL — ABNORMAL HIGH (ref 8–23)
CO2: 18 mmol/L — ABNORMAL LOW (ref 22–32)
Calcium: 8 mg/dL — ABNORMAL LOW (ref 8.9–10.3)
Chloride: 111 mmol/L (ref 98–111)
Creatinine, Ser: 1.12 mg/dL (ref 0.61–1.24)
GFR calc Af Amer: >60 mL/min (ref >60)
GFR calc non Af Amer: >60 mL/min (ref >60)
Glucose, Bld: 238 mg/dL — ABNORMAL HIGH (ref 70–99)
Potassium: 3.2 mmol/L — ABNORMAL LOW (ref 3.5–5.1)
Sodium: 141 mmol/L (ref 135–145)

## 2020-01-03 LAB — POCT I-STAT 7, (LYTES, BLD GAS, ICA,H+H)
Acid-base deficit: 1 mmol/L (ref 0.0–2.0)
Bicarbonate: 19.8 mmol/L — ABNORMAL LOW (ref 20.0–28.0)
Calcium, Ion: 1.14 mmol/L — ABNORMAL LOW (ref 1.15–1.40)
HCT: 35 % — ABNORMAL LOW (ref 39.0–52.0)
Hemoglobin: 11.9 g/dL — ABNORMAL LOW (ref 13.0–17.0)
O2 Saturation: 100 %
Patient temperature: 99.2
Potassium: 3.2 mmol/L — ABNORMAL LOW (ref 3.5–5.1)
Sodium: 143 mmol/L (ref 135–145)
TCO2: 21 mmol/L — ABNORMAL LOW (ref 22–32)
pCO2 arterial: 23.2 mmHg — ABNORMAL LOW (ref 32.0–48.0)
pH, Arterial: 7.54 — ABNORMAL HIGH (ref 7.350–7.450)
pO2, Arterial: 176 mmHg — ABNORMAL HIGH (ref 83.0–108.0)

## 2020-01-03 LAB — GLUCOSE, CAPILLARY
Glucose-Capillary: 202 mg/dL — ABNORMAL HIGH (ref 70–99)
Glucose-Capillary: 214 mg/dL — ABNORMAL HIGH (ref 70–99)
Glucose-Capillary: 216 mg/dL — ABNORMAL HIGH (ref 70–99)
Glucose-Capillary: 228 mg/dL — ABNORMAL HIGH (ref 70–99)
Glucose-Capillary: 287 mg/dL — ABNORMAL HIGH (ref 70–99)

## 2020-01-03 LAB — COMPREHENSIVE METABOLIC PANEL
ALT: 50 U/L — ABNORMAL HIGH (ref 0–44)
AST: 53 U/L — ABNORMAL HIGH (ref 15–41)
Albumin: 1.6 g/dL — ABNORMAL LOW (ref 3.5–5.0)
Alkaline Phosphatase: 117 U/L (ref 38–126)
Anion gap: 9 (ref 5–15)
BUN: 39 mg/dL — ABNORMAL HIGH (ref 8–23)
CO2: 19 mmol/L — ABNORMAL LOW (ref 22–32)
Calcium: 7.7 mg/dL — ABNORMAL LOW (ref 8.9–10.3)
Chloride: 112 mmol/L — ABNORMAL HIGH (ref 98–111)
Creatinine, Ser: 1.09 mg/dL (ref 0.61–1.24)
GFR calc Af Amer: >60 mL/min (ref >60)
GFR calc non Af Amer: >60 mL/min (ref >60)
Glucose, Bld: 253 mg/dL — ABNORMAL HIGH (ref 70–99)
Potassium: 2.9 mmol/L — ABNORMAL LOW (ref 3.5–5.1)
Sodium: 140 mmol/L (ref 135–145)
Total Bilirubin: 3.6 mg/dL — ABNORMAL HIGH (ref 0.3–1.2)
Total Protein: 5.2 g/dL — ABNORMAL LOW (ref 6.5–8.1)

## 2020-01-03 LAB — VANCOMYCIN, TROUGH: Vancomycin Tr: 26 ug/mL (ref 15–20)

## 2020-01-03 LAB — MAGNESIUM
Magnesium: 1.7 mg/dL (ref 1.7–2.4)
Magnesium: 1.6 mg/dL — ABNORMAL LOW (ref 1.7–2.4)
Magnesium: 1.2 mg/dL — ABNORMAL LOW (ref 1.7–2.4)

## 2020-01-03 LAB — CBC
HCT: 34.9 % — ABNORMAL LOW (ref 39.0–52.0)
Hemoglobin: 11.8 g/dL — ABNORMAL LOW (ref 13.0–17.0)
MCH: 32.6 pg (ref 26.0–34.0)
MCHC: 33.8 g/dL (ref 30.0–36.0)
MCV: 96.4 fL (ref 80.0–100.0)
Platelets: 95 10*3/uL — ABNORMAL LOW (ref 150–400)
RBC: 3.62 MIL/uL — ABNORMAL LOW (ref 4.22–5.81)
RDW: 13.8 % (ref 11.5–15.5)
WBC: 23.6 10*3/uL — ABNORMAL HIGH (ref 4.0–10.5)
nRBC: 0.2 % (ref 0.0–0.2)

## 2020-01-03 LAB — ECHOCARDIOGRAM COMPLETE
Height: 70 in
Weight: 5008.85 oz

## 2020-01-03 LAB — PHOSPHORUS
Phosphorus: 2.7 mg/dL (ref 2.5–4.6)
Phosphorus: 2.9 mg/dL (ref 2.5–4.6)
Phosphorus: 3 mg/dL (ref 2.5–4.6)

## 2020-01-03 LAB — TRIGLYCERIDES: Triglycerides: 313 mg/dL — ABNORMAL HIGH (ref <150)

## 2020-01-03 IMAGING — DX DG ABD PORTABLE 1V
1 series · 2 of 2 positions shown · non-contrast
Comparison: None.

CLINICAL DATA: Evaluate enteric tube.

EXAM:
PORTABLE ABDOMEN - 1 VIEW

[Series 2: abdomen · 0.14mm/px · 2 of 2 slices shown]
[im 1/2]
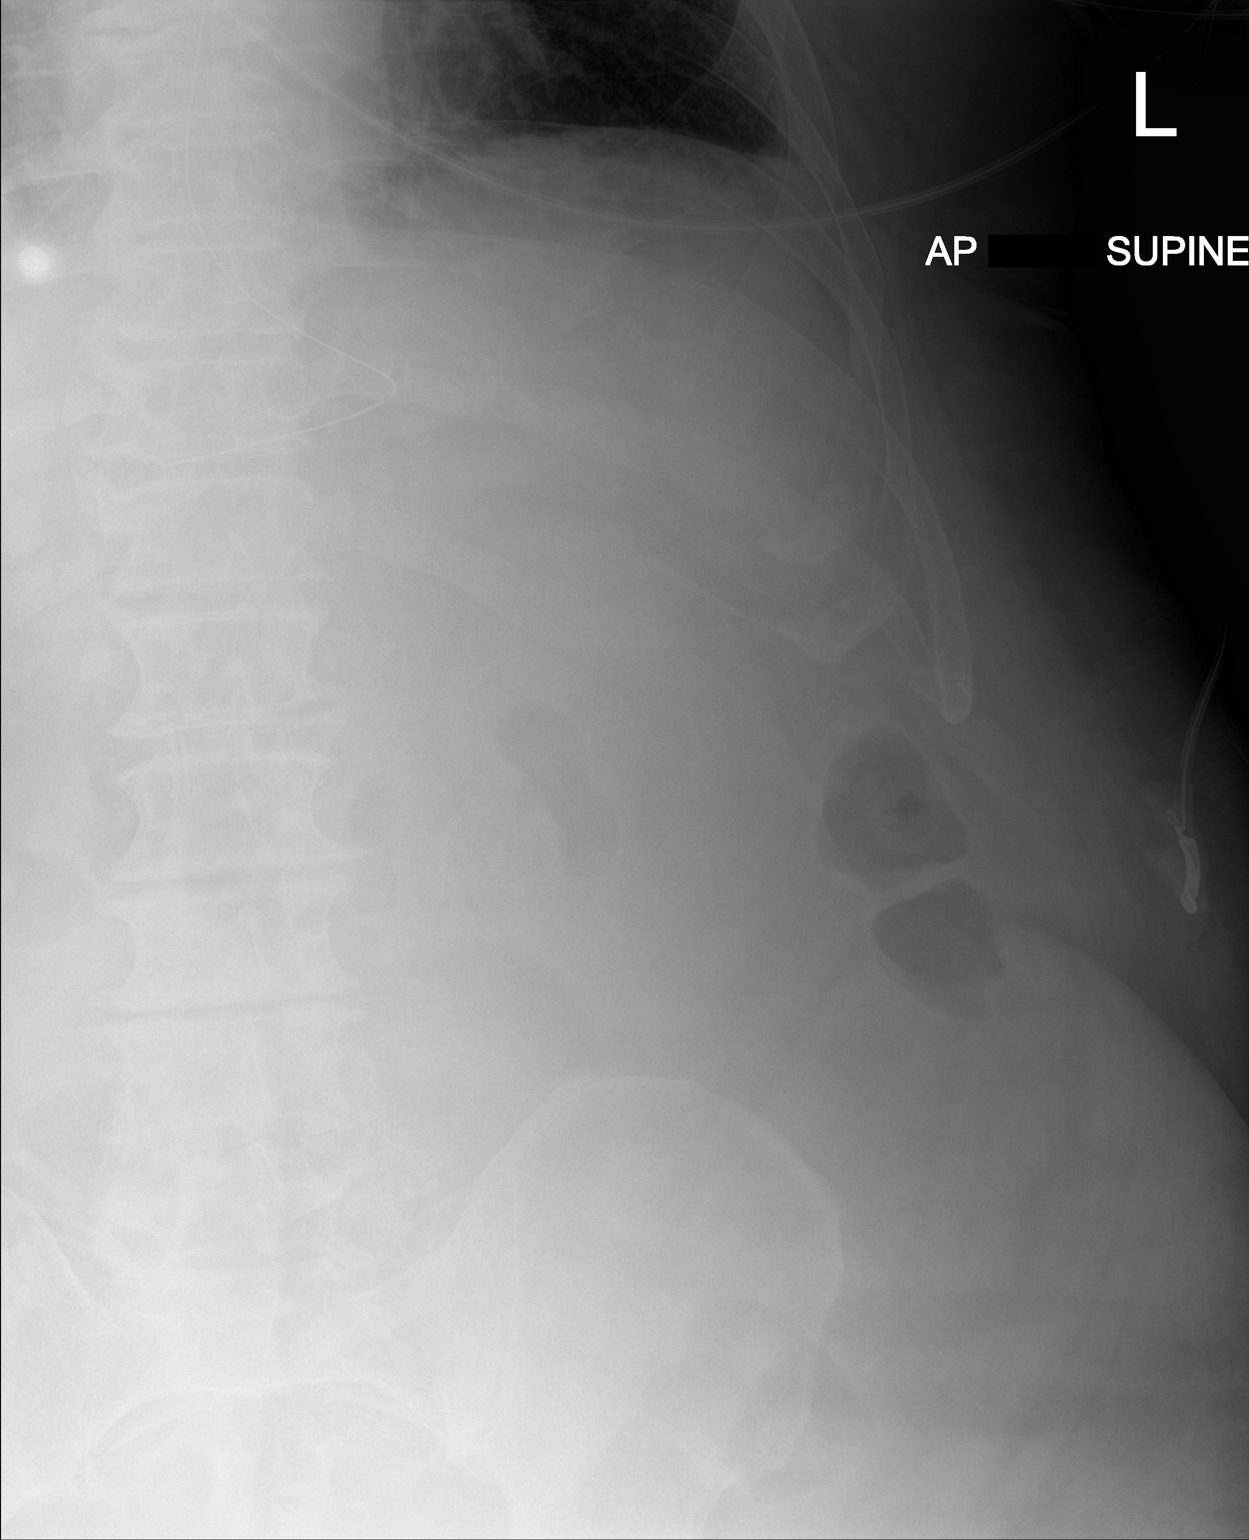
[im 2/2]
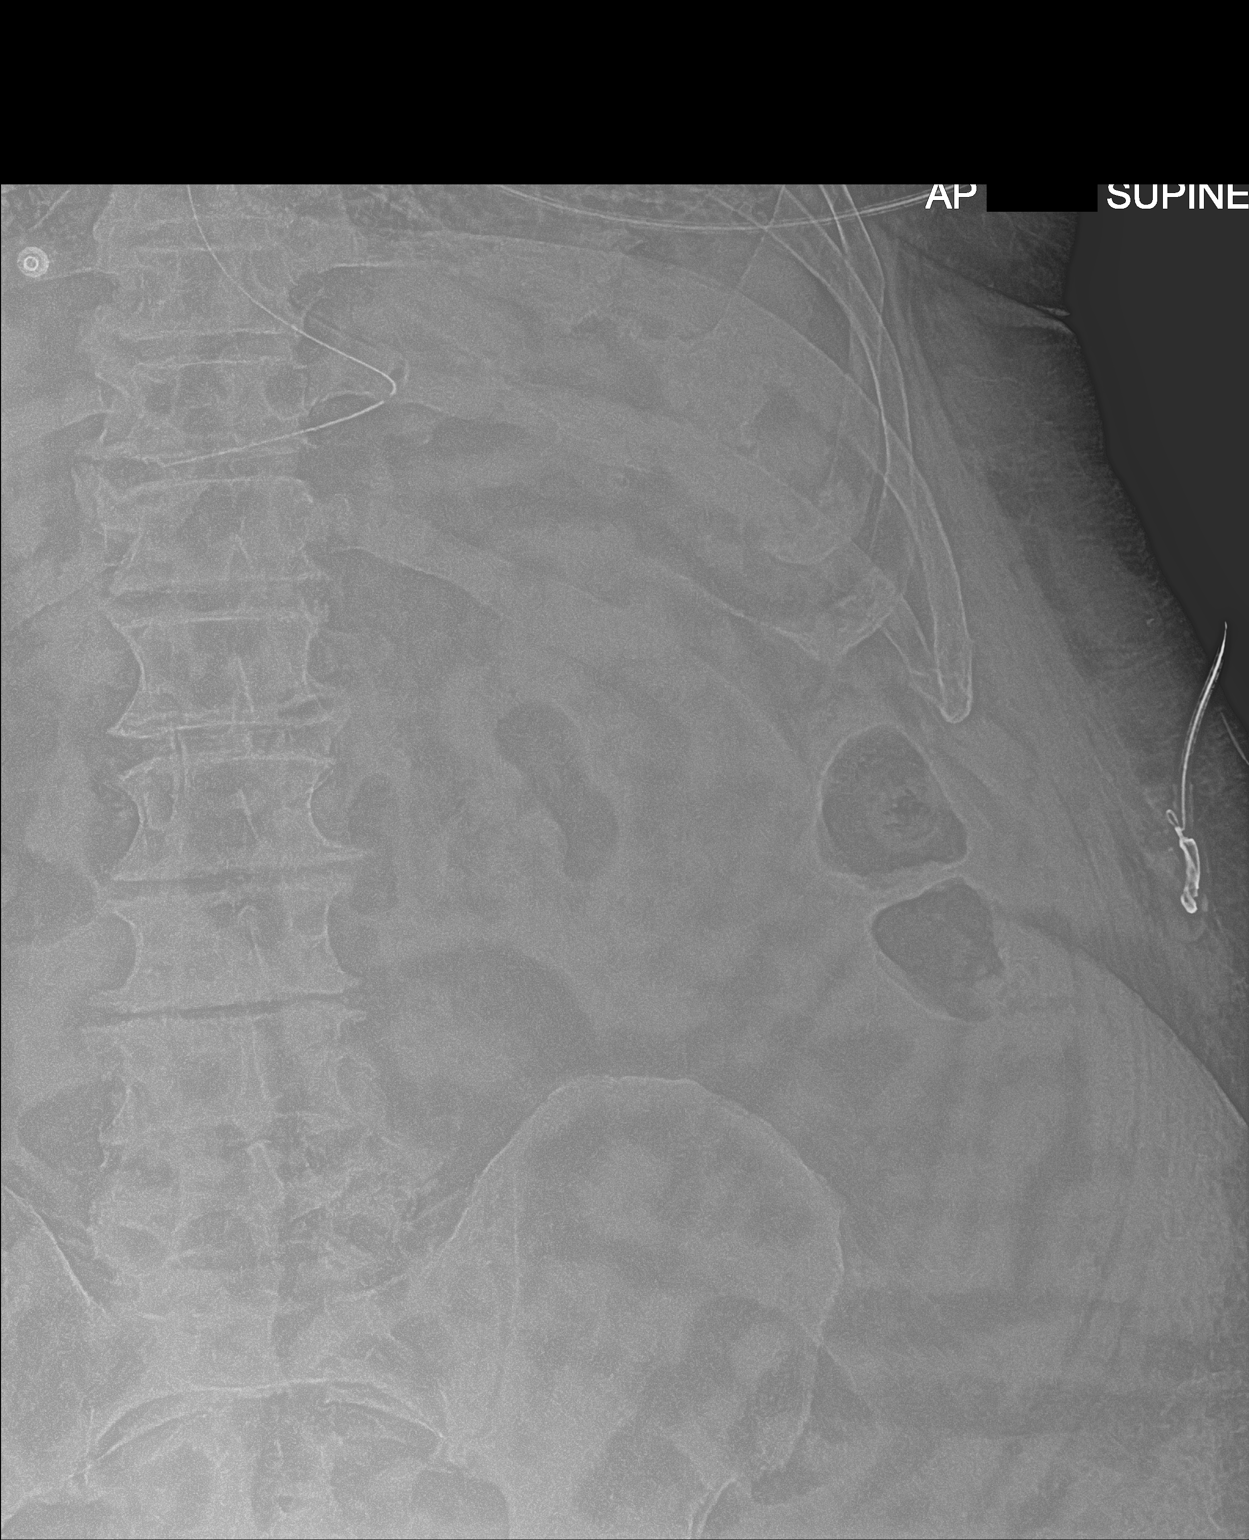

[2 of 2 positions shown; findings below may reference images not displayed]

FINDINGS: Evidence of patient's nasogastric tube with tip over the midline in
the upper abdomen likely over the distal body of the stomach.
Visualized bowel gas pattern is nonobstructive. There are
degenerative changes of the spine.
IMPRESSION: Nasogastric tube with tip over the midline upper abdomen likely over
the distal body of the stomach.

## 2020-01-03 IMAGING — RF DG FLUORO GUIDE SPINAL/SI JT INJ*L*
13 of 14 series · 14 of 17 positions shown · non-contrast
Comparison: none

CLINICAL DATA: 68-year-old male with a history of acute in
cephalopathy with concern for central nervous system infection
referred for LP

[Series 1: fluoro_iodine 2fps_bw · 0.17mm/px · 1 of 1 slices shown (1 of 13)]
[im 1/1]
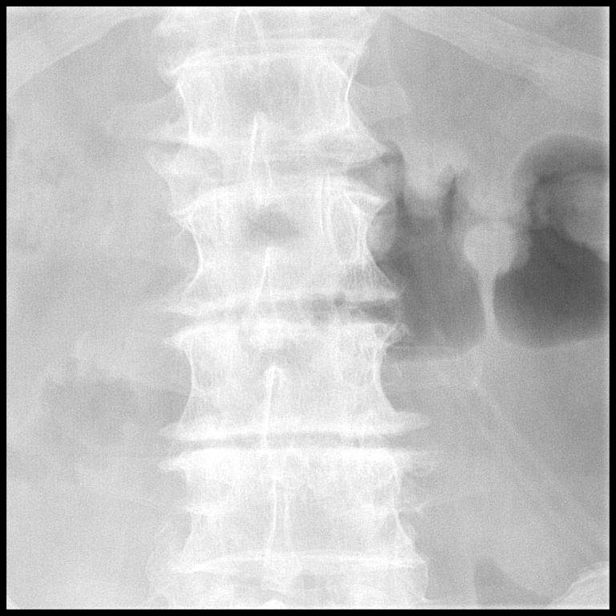

[Series 2: fluoro_iodine 2fps_bw · 0.17mm/px · 1 of 2 frames shown (2 of 13)]
[frame 1/2]
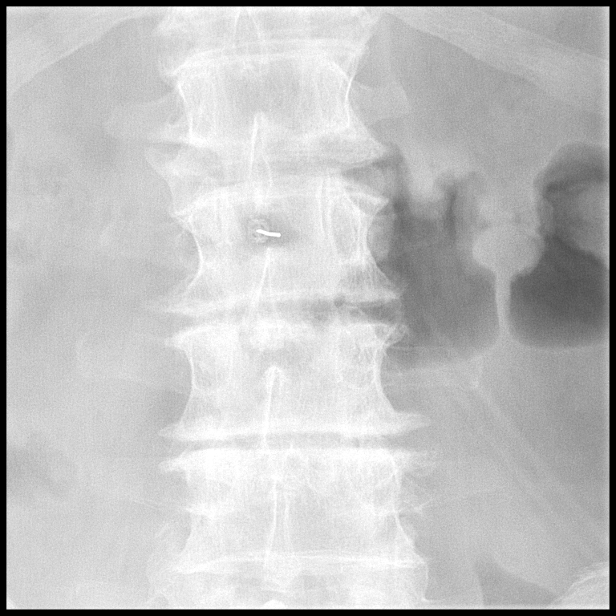

[Series 3: fluoro_iodine 2fps_bw · 0.17mm/px · 1 of 1 slices shown (3 of 13)]
[im 1/1]
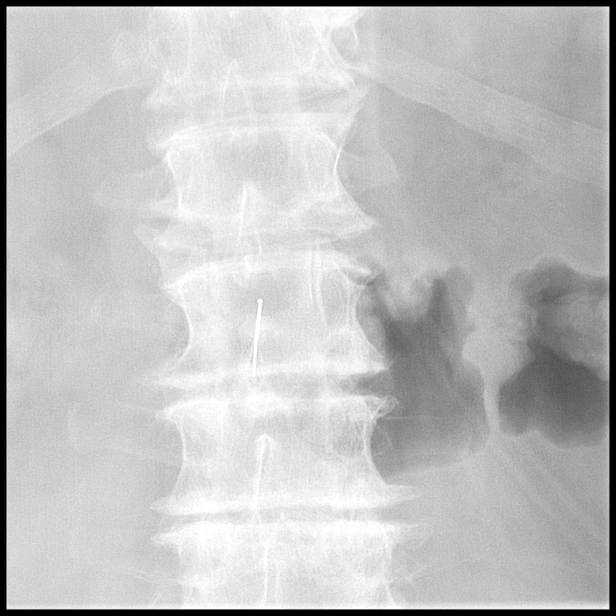

[Series 4: fluoro_iodine 2fps_bw · 0.17mm/px · 1 of 1 slices shown (4 of 13)]
[im 1/1]
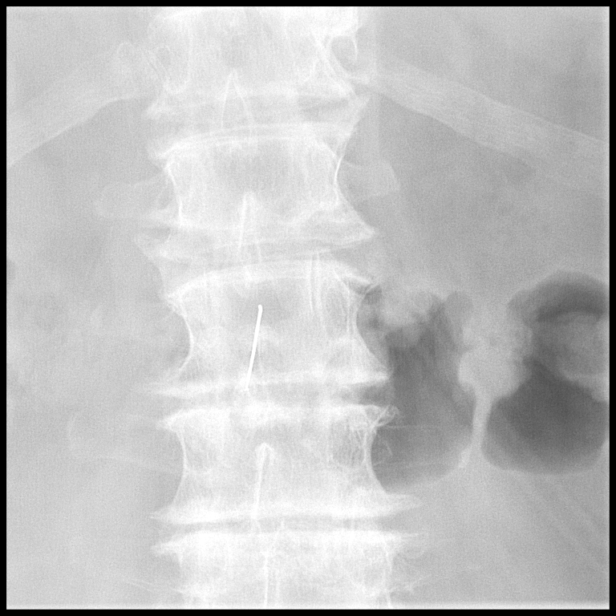

[Series 5: fluoro_iodine 2fps_bw · 0.17mm/px · 2 of 2 frames shown (5 of 13)]
[frame 1/2]
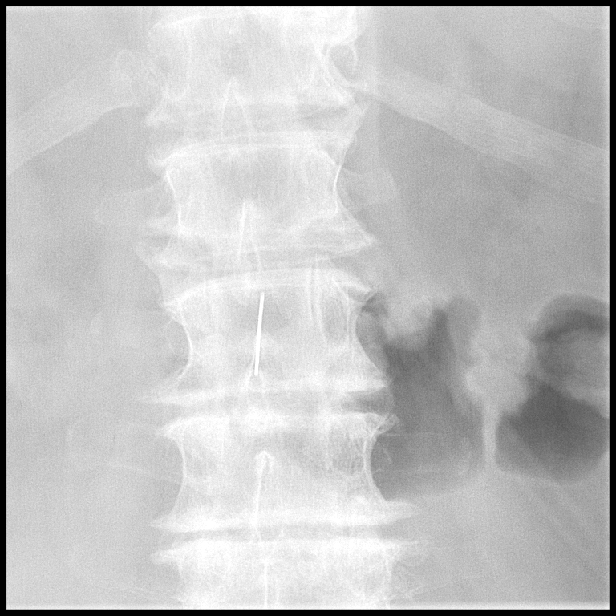
[frame 2/2]
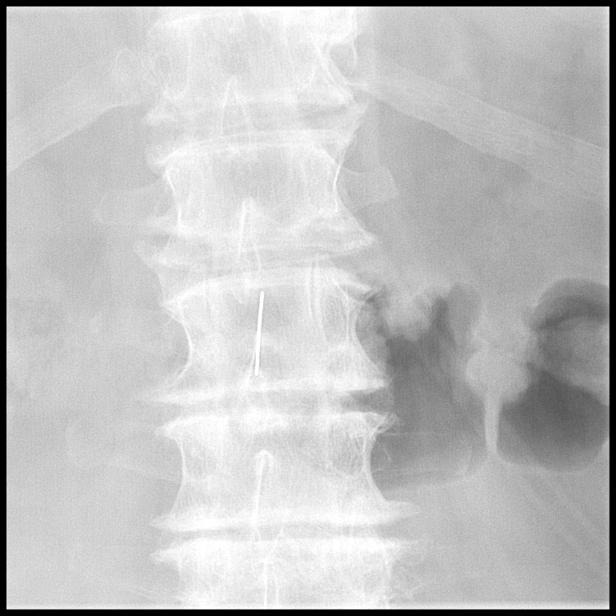

[Series 6: fluoro_iodine 2fps_bw · 0.17mm/px · 1 of 1 slices shown (6 of 13)]
[im 1/1]
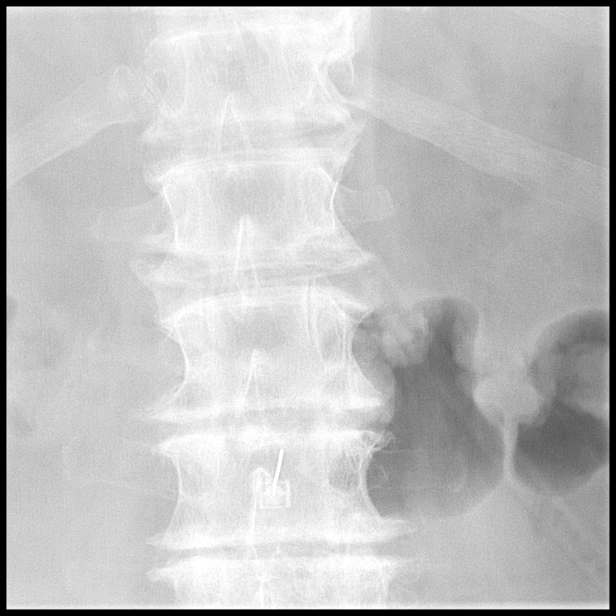

[Series 8: fluoro_iodine 2fps_bw · 0.17mm/px · 1 of 1 slices shown (7 of 13)]
[im 1/1]
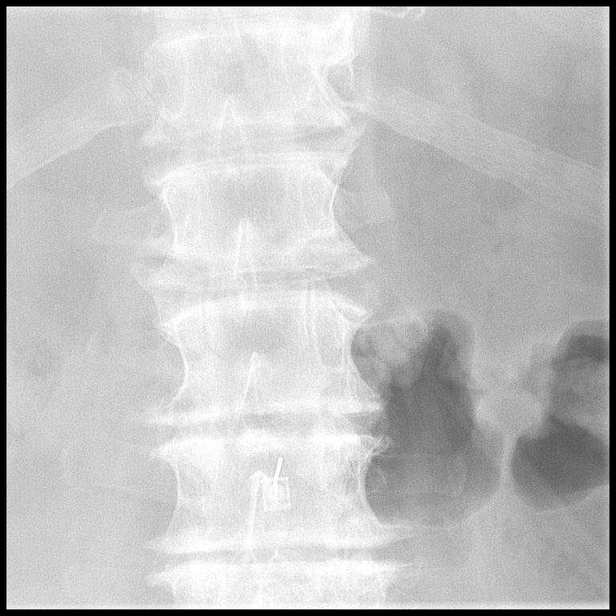

[Series 9: fluoro_iodine 2fps_bw · 0.17mm/px · 1 of 1 slices shown (8 of 13)]
[im 1/1]
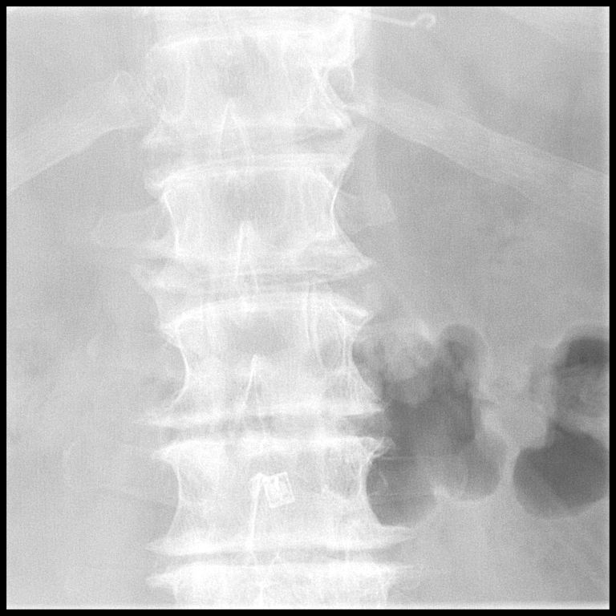

[Series 10: fluoro_iodine 2fps_bw · 0.17mm/px · 1 of 1 slices shown (9 of 13)]
[im 1/1]
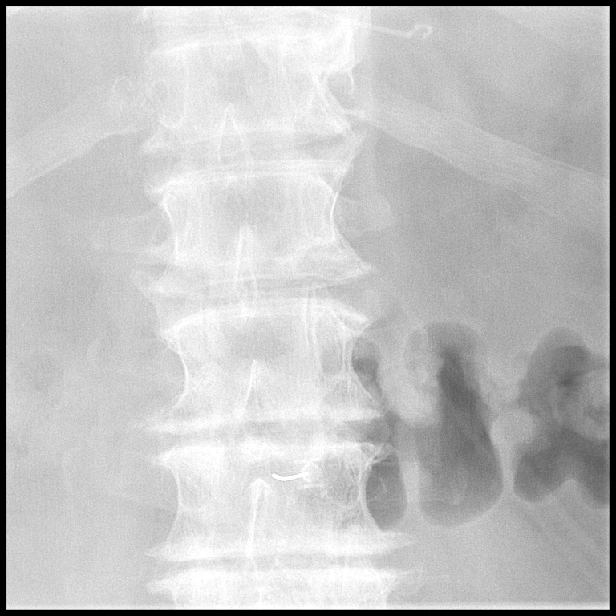

[Series 11: fluoro_iodine 2fps_bw · 0.17mm/px · 1 of 1 slices shown (10 of 13)]
[im 1/1]
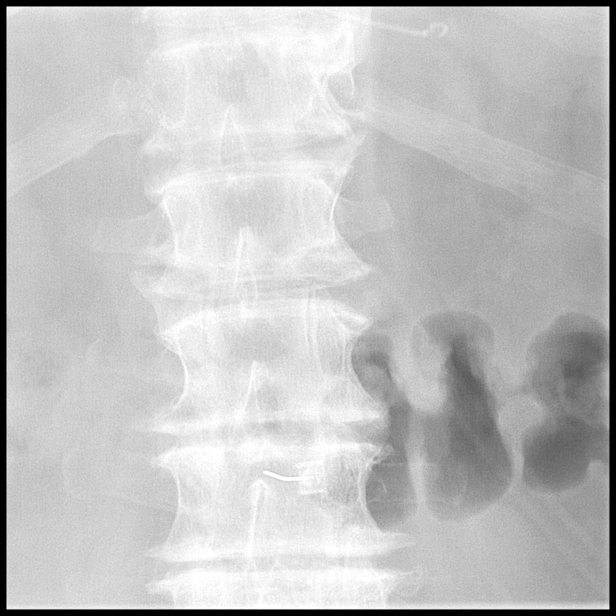

[Series 12: fluoro_iodine 2fps_bw · 0.17mm/px · 1 of 1 slices shown (11 of 13)]
[im 1/1]
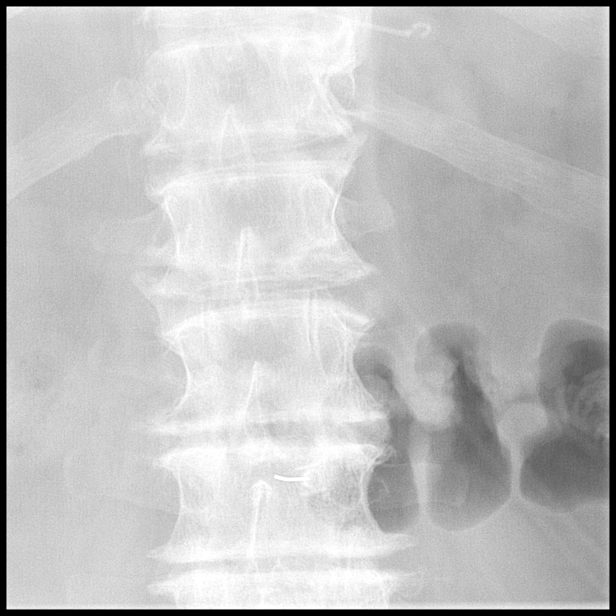

[Series 13: fluoro_iodine 2fps_bw · 0.17mm/px · 1 of 2 frames shown (12 of 13)]
[frame 2/2]
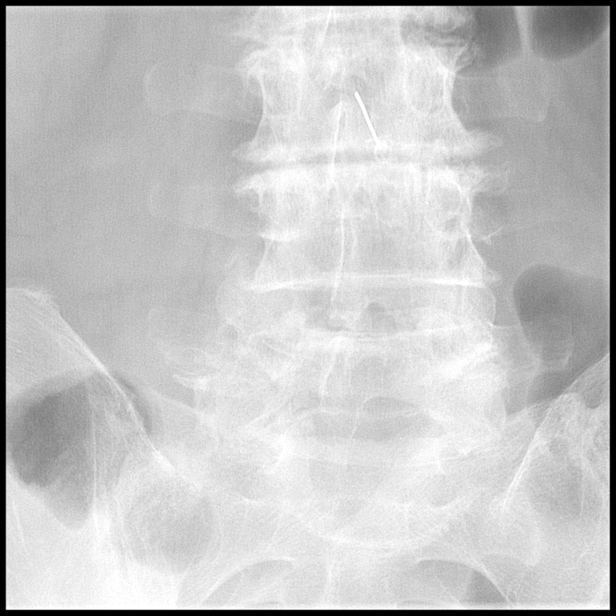

[Series 14: fluoro_iodine 2fps_bw · 0.17mm/px · 1 of 1 slices shown (13 of 13)]
[im 1/1]
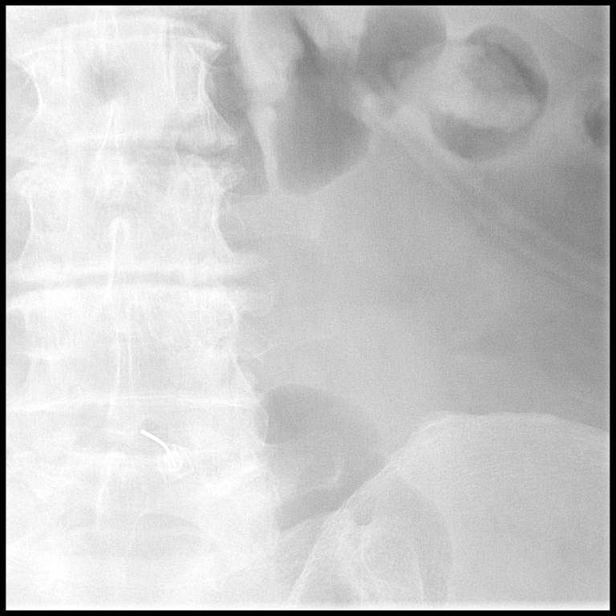

[14 of 17 positions shown; findings below may reference images not displayed]

EXAM:
DIAGNOSTIC LUMBAR PUNCTURE UNDER FLUOROSCOPIC GUIDANCE

FLUOROSCOPY TIME:  Fluoroscopy Time:  3 minutes

PROCEDURE:
Informed consent was obtained prior to the procedure, including
potential complications of headache, allergy, and pain. With the
patient prone, the lower back was prepped with Betadine. 1%
Lidocaine was used for local anesthesia.

Dr. CHANDELON initiated the procedure, and I assisted with the
procedure after the initial needle placement.

Lumbar puncture was first performed at the L2-L3 level using a 20
gauge, 5 inch needle. Multiple repositioning of the needle yielded
no CSF at this level.

Lumbar puncture was then performed at the L3-L4 level using first a
3-1/2 inch, 20 gauge needle. Multiple repositioning performed at
this level yielding no CSF. A 5 inch 20 gauge needle was also used
at this level.

Lumbar puncture was then performed at the L4-L5 level using 5 inch
20 gauge needle. Upon initial placement, there was spontaneous
return of what appear to be some viscous yellow fluid to the hub of
the needle, with no further significant volume.

Strategies used included reversed Trendelenburg position. The
patient is intubated and cannot provide a Valsalva maneuver. The
patient cannot be position into a decubitus position given his body
habitus.

The volume of fluid that was acquired was approximately 0.2 cc which
was sent for a culture.

Bandages were placed at the completion.
IMPRESSION: Attempt at fluoroscopic LP, using an approach at L2-L3, L3-L4,
L4-L5, with very scant fluid returned at the lowest level. A small
sample was sent for culture.

PLAN:
If another attempt is required, would suggest aggressive hydration
of the patient prior to performing.

## 2020-01-03 MED ORDER — PRO-STAT SUGAR FREE PO LIQD
30.0000 mL | Freq: Two times a day (BID) | ORAL | Status: DC
  Administered 2020-01-03 – 2020-01-05 (×5): 30 mL
  Filled 2020-01-03 (×5): qty 30

## 2020-01-03 MED ORDER — DILTIAZEM HCL 25 MG/5ML IV SOLN: 5.0000 mg | Freq: Once | INTRAVENOUS | Status: DC

## 2020-01-03 MED ORDER — DILTIAZEM LOAD VIA INFUSION
5.0000 mg | Freq: Once | INTRAVENOUS | Status: AC
  Administered 2020-01-03: 5 mg via INTRAVENOUS
  Filled 2020-01-03: qty 5

## 2020-01-03 MED ORDER — DILTIAZEM HCL-DEXTROSE 125-5 MG/125ML-% IV SOLN (PREMIX)
5.0000 mg/h | INTRAVENOUS | Status: DC
  Administered 2020-01-03: 5 mg/h via INTRAVENOUS
  Administered 2020-01-03 – 2020-01-05 (×6): 15 mg/h via INTRAVENOUS
  Administered 2020-01-06 (×2): 10 mg/h via INTRAVENOUS
  Administered 2020-01-06 – 2020-01-08 (×4): 15 mg/h via INTRAVENOUS
  Filled 2020-01-03 (×16): qty 125

## 2020-01-03 MED ORDER — PERFLUTREN LIPID MICROSPHERE
1.0000 mL | INTRAVENOUS | Status: AC | PRN
  Administered 2020-01-03: 4 mL via INTRAVENOUS
  Filled 2020-01-03: qty 10

## 2020-01-03 MED ORDER — AMIODARONE LOAD VIA INFUSION
150.0000 mg | Freq: Once | INTRAVENOUS | Status: AC
  Administered 2020-01-03: 150 mg via INTRAVENOUS
  Filled 2020-01-03: qty 83.34

## 2020-01-03 MED ORDER — POTASSIUM CHLORIDE 20 MEQ PO PACK
40.0000 meq | PACK | Freq: Two times a day (BID) | ORAL | Status: AC
  Administered 2020-01-03 – 2020-01-05 (×5): 40 meq via ORAL
  Filled 2020-01-03 (×5): qty 2

## 2020-01-03 MED ORDER — POLYETHYLENE GLYCOL 3350 17 G PO PACK
17.0000 g | PACK | Freq: Every day | ORAL | Status: DC
  Administered 2020-01-04 – 2020-01-08 (×5): 17 g
  Filled 2020-01-03 (×6): qty 1

## 2020-01-03 MED ORDER — DOCUSATE SODIUM 50 MG/5ML PO LIQD
100.0000 mg | Freq: Two times a day (BID) | ORAL | Status: DC
  Administered 2020-01-03 – 2020-01-08 (×10): 100 mg
  Filled 2020-01-03 (×11): qty 10

## 2020-01-03 MED ORDER — VITAL HIGH PROTEIN PO LIQD
1000.0000 mL | ORAL | Status: DC
  Administered 2020-01-03 – 2020-01-04 (×2): 1000 mL

## 2020-01-03 MED ORDER — VANCOMYCIN HCL 1500 MG/300ML IV SOLN
1500.0000 mg | Freq: Two times a day (BID) | INTRAVENOUS | Status: DC
  Administered 2020-01-04 – 2020-01-05 (×3): 1500 mg via INTRAVENOUS
  Filled 2020-01-03 (×5): qty 300

## 2020-01-03 MED ORDER — LACTATED RINGERS IV BOLUS
1000.0000 mL | Freq: Once | INTRAVENOUS | Status: AC
  Administered 2020-01-03: 1000 mL via INTRAVENOUS

## 2020-01-03 MED ORDER — ACETAMINOPHEN 160 MG/5ML PO SOLN
650.0000 mg | ORAL | Status: DC | PRN
  Administered 2020-01-03 – 2020-01-07 (×10): 650 mg
  Filled 2020-01-03 (×10): qty 20.3

## 2020-01-03 MED ORDER — HEPARIN SODIUM (PORCINE) 5000 UNIT/ML IJ SOLN
5000.0000 [IU] | Freq: Three times a day (TID) | INTRAMUSCULAR | Status: DC
  Administered 2020-01-03 – 2020-01-08 (×14): 5000 [IU] via SUBCUTANEOUS
  Filled 2020-01-03 (×14): qty 1

## 2020-01-03 MED ORDER — AMIODARONE IV BOLUS ONLY 150 MG/100ML: 150.0000 mg | Freq: Once | INTRAVENOUS | Status: DC

## 2020-01-03 MED ORDER — CHLORHEXIDINE GLUCONATE CLOTH 2 % EX PADS
6.0000 | MEDICATED_PAD | Freq: Every day | CUTANEOUS | Status: DC
  Administered 2020-01-05 – 2020-01-08 (×5): 6 via TOPICAL

## 2020-01-03 NOTE — Progress Notes (Signed)
eLink Physician-Brief Progress Note Patient Name: Isaac Hamilton DOB: Jul 30, 1950 MRN: [id number]   Date of Service  01/03/2020  HPI/Events of Note  Atrial fibrillation with RVR  And rates as high as 150 bpm despite an Amiodarone infusion going at 30 mg / hour. The ventricular response rate more controlled recently with the addition of a Diltiazem infusion,  but the infusion fell off the MAR.  eICU Interventions  Diltiazem infusion ordered without a bolus.        Thomasene Lot Tarisha Fader 01/03/2020, 2:15 AM

## 2020-01-03 NOTE — Progress Notes (Signed)
eLink Physician-Brief Progress Note Patient Name: Isaac Hamilton DOB: 05-28-1951 MRN: [id number]   Date of Service  01/03/2020  HPI/Events of Note  Persistent rapid ventricular response rate of 160's on maintenance Amiodarone infusion + Diltiazem infusion, SBP now 112 precluding 10 mg Diltiazem bolus.  eICU Interventions  Amiodarone 150 mg iv bolus x 1        Arihant Pennings U Linet Brash 01/03/2020, 4:11 AM

## 2020-01-03 NOTE — Progress Notes (Signed)
Pharmacy Antibiotic Note  Isaac Hamilton is a 69 y.o. male transferred to [facility] from [facility] on 12/24/2019 with MRSA bacteremia and mental status changes, with concern for meningitis and endocarditis. Hospitalization at [facility] was complicated by thrombocytopenia; TEE not pursed there due to thrombocytopenia. LP not able to be done at OSH, due to pt's body habitus at OSH. Pharmacy has been consulted for vancomycin dosing for bacteremia/meningitis, and ceftriaxone/ampicillin for meningitis.  Vancomycin trough elevated at 26 mcg/ml, Cr up this morning from admit.   Plan: -Reduce vancomycin to 1500mg  IV q12h -Follow renal function closely -Recheck trough at new Css   Height: 5\' 10"  (177.8 cm) Weight: (!) 142 kg (313 lb 0.9 oz) IBW/kg (Calculated) : 73  Temp (24hrs), Avg:99.6 F (37.6 C), Min:98.6 F (37 C), Max:100.4 F (38 C)  Recent Labs  Lab 01/13/2020 1800 01/08/2020 1911 01/03/20 0508 01/03/20 1324 01/03/20 1804  WBC 23.4*  --  23.6*  --   --   CREATININE 0.87  --  1.12 1.09  --   LATICACIDVEN 2.4* 2.4*  --   --   --   VANCOTROUGH  --   --   --   --  26*    Estimated Creatinine Clearance: 92.3 mL/min (by C-G formula based on SCr of 1.09 mg/dL).    No Known Allergies  Antimicrobials this admission: 6/18 vancomycin >> 6/18 ceftriaxone >> 6/18 ampicillin >>  Microbiology results: See above for cx at OSH  Thank you for allowing pharmacy to be a part of this patient's care.  7/18, PharmD, BCPS Clinical Pharmacist [phone] Please check AMION for all [facility] Pharmacy numbers 01/03/2020

## 2020-01-03 NOTE — Procedures (Signed)
Patient Name: Isaac Hamilton  MRN: [id number]  Epilepsy Attending: Charlsie Quest  Referring Physician/Provider: Dr Milon Dikes Date: 2020-01-29  Duration: 23.32 mins  Patient history: 69yo M with ams and left sided weakness. EEG to evaluate for seizure.   Level of alertness: comatose  AEDs during EEG study: Propofol  Technical aspects: This EEG study was done with scalp electrodes positioned according to the 10-20 International system of electrode placement. Electrical activity was acquired at a sampling rate of 500Hz  and reviewed with a high frequency filter of 70Hz  and a low frequency filter of 1Hz . EEG data were recorded continuously and digitally stored.   Description: EEG showed continuous generalized 6-8hz  theta-alpha activity as well as intermittent generalized 15-18Hz  beta activity, maximal frontocentral region. Brief periods of background attenuation lasting 2-3 seconds were also noted. Sharp transients were seen in left temporal region. Hyperventilation and photic stimulation were not performed.     ABNORMALITY -Continuous slow, generalized  IMPRESSION: This study is suggestive of severe diffuse encephalopathy, nonspecific etiology but likely related to sedation. No seizures or definite epileptiform discharges were seen throughout the recording.  [name] 

## 2020-01-03 NOTE — Progress Notes (Signed)
eLink Physician-Brief Progress Note Patient Name: Isaac Hamilton DOB: 09/06/1950 MRN: [id number]   Date of Service  01/03/2020  HPI/Events of Note  Patient with persistent ventricular response rate of 130-150 despite Amiodarone bolus.  eICU Interventions  Diltiazem 5 mg iv x 1     Intervention Category Major Interventions: Arrhythmia - evaluation and management  Reannon Candella U Rohin Krejci 01/03/2020, 5:11 AM

## 2020-01-03 NOTE — Progress Notes (Signed)
EEG complete - results pending 

## 2020-01-03 NOTE — Progress Notes (Signed)
Patient returned from x-ray for LP.

## 2020-01-03 NOTE — Procedures (Signed)
Procedure Note  Procedure:   Attempt at image guided LP.  2 physician attempt. Dr. Mayford Knife, and Dr. Loreta Ave  Findings: 3 levels attempted, L2-L3, L3-L4, L4-L5.    No return of clear CSF.  The final level yielded a flash of yellow fluid, appeared quite viscous.   A sample of .2cc of fluid was sent for cx.   .  Complications: None  Recommendations:  - Recommend follow up of the current culture.  - If another attempt is desired, recommend aggressive hydration for attempt on Monday/Tuesday - Routine wound care    Signed,  Yvone Neu. Loreta Ave, DO

## 2020-01-03 NOTE — Progress Notes (Signed)
  Echocardiogram 2D Echocardiogram has been performed.  Delcie Roch 01/03/2020, 2:15 PM

## 2020-01-03 NOTE — Progress Notes (Signed)
eLink Physician-Brief Progress Note Patient Name: Isaac Hamilton DOB: [birth year] MRN: [id number]   Date of Service  01/03/2020  HPI/Events of Note  Notified of Temp 100.1. Request for Tylenol Already on broad spectrum antibitoics  eICU Interventions  Tylenol prn ordered     Intervention Category Minor Interventions: Routine modifications to care plan (e.g. PRN medications for pain, fever)  Isaac Hamilton Isaac Hamilton 01/03/2020, 9:01 PM

## 2020-01-03 NOTE — Progress Notes (Addendum)
NEURO HOSPITALIST PROGRESS NOTE   Subjective: Patient in bed sedated, intubated, NAD, breathing above vent.  Exam: Vitals:   01/03/20 0700 01/03/20 0800  BP: 109/85 117/68  Pulse: (!) 146 (!) 135  Resp: (!) 29 (!) 28  Temp:  99.7 F (37.6 C)  SpO2: 99% 98%    Physical Exam  Constitutional: Appears well-developed and well-nourished.  Eyes: Normal external eye and conjunctiva. HENT: Normocephalic, no lesions, without obvious abnormality.   Musculoskeletal-no joint tenderness, deformity or swelling Cardiovascular: Normal rate and regular rhythm.  Respiratory: Effort normal, non-labored breathing saturations WNL GI: Soft.  No distension. There is no tenderness.  Skin: WDI   Neuro: exam done after propofol paused: Mental Status: Sedated, intubated, breathing above vent, Cranial Nerves: Pupil  Equally round (10m) and sluggishly reactive to light.no corneal reflexes Face appears symmetric in presence of ETT Motor/sensory: Grimaces to noxious in all 4 and trapezius noxious stimuli Cerebellar: UTA Gait: deferred    Medications:  Scheduled: . amiodarone  150 mg Intravenous Once  . chlorhexidine gluconate (MEDLINE KIT)  15 mL Mouth Rinse BID  . Chlorhexidine Gluconate Cloth  6 each Topical Daily  . docusate  100 mg Per Tube BID  . fentaNYL (SUBLIMAZE) injection  25 mcg Intravenous Once  . insulin aspart  0-15 Units Subcutaneous Q4H  . mouth rinse  15 mL Mouth Rinse 10 times per day  . pantoprazole (PROTONIX) IV  40 mg Intravenous QHS  . [START ON 01/04/2020] polyethylene glycol  17 g Per Tube Daily   Continuous: . amiodarone 30 mg/hr (01/03/20 0800)  . ampicillin (OMNIPEN) IV 2 g (01/03/20 0900)  . cefTRIAXone (ROCEPHIN)  IV 200 mL/hr at 01/03/20 0800  . diltiazem (CARDIZEM) infusion 15 mg/hr (01/03/20 0800)  . fentaNYL infusion INTRAVENOUS 125 mcg/hr (01/03/20 0825)  . propofol (DIPRIVAN) infusion 40 mcg/kg/min (01/03/20 0826)  . vancomycin 2,000  mg (01/03/20 0825)   PAOZ:HYQMVHQIsodium, fentaNYL, polyethylene glycol  Pertinent Labs/Diagnostics:   MR BRAIN WO CONTRAST  Result Date: 12/19/2019 CLINICAL DATA:  Acute encephalopathy. MRSA bacteremia. Possible stroke. EXAM: MRI HEAD WITHOUT CONTRAST TECHNIQUE: Multiplanar, multiecho pulse sequences of the brain and surrounding structures were obtained without intravenous contrast. COMPARISON:  Head CT 12/30/2019 FINDINGS: Brain: There are punctate foci of trace diffusion weighted signal hyperintensity along the posterior aspects of the atria/occipital horns of both lateral ventricles with evidence of trace dependently layering material in the left lateral ventricle on FLAIR imaging (series 16, image 13). There is no associated susceptibility artifact to indicate hemorrhage. There is no periventricular edema. A few punctate foci of T2 hyperintensity elsewhere in the cerebral white matter bilaterally are nonspecific but compatible with minimal chronic small vessel ischemic disease with a chronic lacunar infarct noted in the right corona radiata. No sizable acute infarct, mass, midline shift, or extra-axial fluid collection is identified. Mild cerebral atrophy is within normal limits for age. Vascular: Major intracranial vascular flow voids are preserved. Skull and upper cervical spine: Unremarkable bone marrow signal. Sinuses/Orbits: Unremarkable orbits. Mild left greater than right ethmoid and left maxillary sinus mucosal thickening. No significant mastoid fluid. Other: None. IMPRESSION: 1. Minimal signal abnormality/debris in the dependent portions of both lateral ventricles most suggestive of infection with this history. No periventricular edema or hydrocephalus. Consider lumbar puncture for further evaluation. 2. No other evidence of acute intracranial abnormality. 3. Minimal chronic small vessel ischemic disease. Electronically  Signed   By: Logan Bores M.D.   On: 12/16/2019 17:26   DG CHEST PORT 1  VIEW  Result Date: 01/04/2020 CLINICAL DATA:  Intubation. EXAM: PORTABLE CHEST 1 VIEW COMPARISON:  12/28/2019 FINDINGS: An endotracheal tube has been placed and terminates approximately 3.5 cm above the carina. The cardiomediastinal silhouette is unchanged with normal heart size. The lungs are mildly hypoinflated without evidence of pulmonary edema, confluent airspace opacity, or pneumothorax. Trace pleural effusions are questioned. IMPRESSION: 1. Endotracheal tube as above. 2. Possible trace pleural effusions. Electronically Signed   By: Logan Bores M.D.   On: 12/25/2019 18:21   Assessment:  69 year old man with multiple cardiovascular risk factors, transferred from [facility] with multiple issues including MRSA bacteremia, new onset atrial fibrillation with RVR, deranged liver and kidney functions, noted to be extremely lethargic and weaker on the left side for which a code stroke was activated, and canceled. Family reports him being altered in mentation for the past 2 days if not more.Initial examination did reveal left hemiparesis to noxious stimulation-he was not very cooperative with exam as he was in acute respiratory distress.  MRI brain to rule out an acute stroke: MRI was negative for stroke. Routine EEG : pending  Other differentials to consider at this time: -Toxic metabolic encephalopathy -CNS infection cannot be ruled out-LP recommended under fluoro d/t large body habitus - MRI is suggestive of possible CNS infection with ventricular debris. -Stroke less likely -Rule out seizure-based on the clinical exam also less likely.  Other active issues: -New onset atrial fibrillation with RVR. -Acute respiratory failure -Transaminitis -AKI -Thrombocytopenia  Recommendations:  -consider spine MRI since initial presentation was back pain and then he worsened and is now septic. -fluoro guided LP -routine EEG -continue broad spectrum abx for CNS infection -consider ID consult as  well.  Laurey Morale, MSN, NP-C [facility] [phone]  Attending neurologist's note to follow  01/03/2020, 9:51 AM   Attending Neurohospitalist Addendum Patient seen and examined with APP/Resident. Agree with the history and physical as documented above. Agree with the plan as documented, which I helped formulate. I have independently reviewed the chart, obtained history, review of systems and examined the patient.I have personally reviewed pertinent head/neck/spine imaging (CT/MRI). Please feel free to call with any questions. --- Amie Portland, MD [facility] Pager: [phone]  If 7pm to 7am, please call on call as listed on AMION.

## 2020-01-03 NOTE — Plan of Care (Signed)
MRI with possible debris in the ventricles suggestive of infection. LP under fluoro ordered. On broad meningitic abx coverage. Abx therapy to be guided by cultures -blood and also CSF when available.  -- Milon Dikes, MD [facility] Pager: [phone] If 7pm to 7am, please call on call as listed on AMION.

## 2020-01-03 NOTE — Progress Notes (Signed)
RT NOTE: ° °Sputum sample collected, labeled and sent to main lab. °

## 2020-01-03 NOTE — Progress Notes (Addendum)
NAME:  Jed Kutch, MRN:  [id number], DOB:  1951-03-18, LOS: 1 ADMISSION DATE:  12/22/2019, CONSULTATION DATE:  12/30/2019 REFERRING MD:  Ophelia Charter - TRH, CHIEF COMPLAINT:  Low back pain, fatigue , AMS + MRSA Bacteremia  Brief History   69 yo M transferred to [facility] to [facility] service 6/18. Upon arrival, unresponsive. PCCM consulted for evaluation. Neurology consulted as code stroke   History of present illness   69 yo M PMH DM, Afib, hx MRSA who transferred to [facility] from [place] with reported MRSA septicemia and inability to obtain MRI due to imaging; admitted to [facility] service, reportedly AOOx4 prior to transfer. Admitted to [facility] for possible back strain in setting of helping sig other move a scooter. Found to have MRSA bacteremia, and worsening malaise, fatigue. Hospitalization at [facility] complicated by AKI, thrombocytopenia, transaminitis, toxic encephalopathy. TEE not pursued due to thrombocytopenia.  Arrives to [facility] on dilt gtt, and unresponsive with L sided weakness. Concern that patient is not protecting airway; PCCM consulted and neurology paged for "code stroke."   PCCM and neurology at bedside with rapid response and RRT.    Past Medical History  DM2 Afib Chronic back pain HLD OA HTN Morbid obesity   Significant Hospital Events   6/18 arrives to [facility], unresponsive.   Consults:  Neurology ID PCCM  Procedures:  6/18 ETT>   Significant Diagnostic Tests:  6/18 CXR >>   Micro Data:  Known MRSA bacteremia  BC at [facility] 6/18-no growth at 24 hours.  Antimicrobials:  Vanc 6/18 >>  Interim history/subjective:  Intubated yesterday for airway protection.  Has continued to have atrial fibrillation with rapid ventricular response today.   Objective   Blood pressure 131/72, pulse (!) 115, temperature 98.7 F (37.1 C), temperature source Oral, resp. rate (!) 30, height 5\' 10"  (1.778 m), weight (!) 142 kg, SpO2 99 %.    Vent Mode: PRVC FiO2 (%):  [40 %-100 %] 40 % Set Rate:  [15  bmp-18 bmp] 15 bmp Vt Set:  [580 mL] 580 mL PEEP:  [5 cmH20-8 cmH20] 5 cmH20 Plateau Pressure:  [15 cmH20-18 cmH20] 17 cmH20   Intake/Output Summary (Last 24 hours) at 01/03/2020 1249 Last data filed at 01/03/2020 0800 Gross per 24 hour  Intake 2015.3 ml  Output 700 ml  Net 1315.3 ml   Filed Weights   12/16/2019 1717 01/03/20 0500  Weight: (!) 142 kg (!) 142 kg    Examination: General: Morbidly obese chronically and critically ill Older adult M, intubated NAD HENT: NCAT. Redundant neck tissue. ETT secure.  Minimal OG tube output Lungs: No ventilator dyssynchrony.  Chest is clear to auscultation bilaterally. Cardiovascular: irir.  Heart sounds are distant.  Capillary refill normal. Abdomen: Obese, soft, ndnt  Extremities: Edematous. No obvious joint deformity. No clubbing  Neuro: Flicker to pain L, R purposeful movement. Moans to tactile stimuli. PERRL GU: foley   I performed a point-of-care echocardiogram which was technically difficult.  Limited acoustic windows.  Normal LV and RV size and function.  Patient in atrial fibrillation.  EF estimated 50%.  IVC 1 cm with 30% respiratory variation indicating possible volume responsiveness.  No obvious valvular abnormalities noted.  Unable to assess diastolic function.  Borderline left-sided filling pressures.  Resolved Hospital Problem list     Assessment & Plan:   Critically ill due to acute respiratory failure in setting of encephalopathy and poor airway protection, requiring intubation, requiring mechanical ventilation.  Nominal oxygen requirements. - Continue full ventilatory support. -Commence  SBT once testing completed and atrial fibrillation better controlled.  Acute Encephalopathy likely toxic metabolic encephalopathy in setting of severe sepsis due to MRSA bacteremia. -Fluoroscopy guided LP to rule out meningitis.  MRSA Bacteremia with severe sepsis.  Currently not on vasopressors.  Patient scratches his skin and nose  frequently.  Likely source of infection. -Continue vancomycin per pharmacy dosing.  Maintain trough 15-20 -TEE to evaluate for endocarditis.  Limited acoustic windows via transthoracic route. -May need MRI to evaluate for epidural abscess given history of associated back pain.  Critically ill due to atrial fibrillation with rapid ventricular response, requiring amiodarone and diltiazem infusions for rate control. Appears volume contracted by echocardiography criteria. -Volume challenge. -Continue amiodarone and diltiazem infusions. -Consider addition of beta-blocker if fails to respond to volume challenge. -Maintain potassium greater than 4 mmol/L.  Best practice:  Diet: Initiate tube feeding Pain/Anxiety/Delirium protocol (if indicated): Continue propofol and fentanyl.  Daily wake up assessment. VAP protocol (if indicated): Bundle in place. DVT prophylaxis: SCD-start subcutaneous heparin.  If imaging favorable will consider starting IV heparin for atrial fibrillation. GI prophylaxis: protonix  Glucose control: SSI  Mobility: BR Code Status: Full  Family Communication: I updated the sons today at the bedside. Disposition: to ICU   Labs   CBC: Recent Labs  Lab 12/28/2019 1800 12/23/2019 1831 01/03/20 0356 01/03/20 0508  WBC 23.4*  --   --  23.6*  NEUTROABS 19.5*  --   --   --   HGB 12.3* 12.6* 11.9* 11.8*  HCT 35.5* 37.0* 35.0* 34.9*  MCV 96.5  --   --  96.4  PLT ACLMP  --   --  95*    Basic Metabolic Panel: Recent Labs  Lab 01/01/2020 1800 01/13/2020 1831 01/03/20 0356 01/03/20 0508  NA 141 143 143 141  K 3.4* 3.3* 3.2* 3.2*  CL 112*  --   --  111  CO2 20*  --   --  18*  GLUCOSE 263*  --   --  238*  BUN 31*  --   --  40*  CREATININE 0.87  --   --  1.12  CALCIUM 8.0*  --   --  8.0*  MG 1.7  --   --  1.7  PHOS 3.2  --   --  2.7   GFR: Estimated Creatinine Clearance: 89.8 mL/min (by C-G formula based on SCr of 1.12 mg/dL). Recent Labs  Lab 01/13/2020 1800  12/30/2019 1911 01/03/20 0508  PROCALCITON 0.86  --   --   WBC 23.4*  --  23.6*  LATICACIDVEN 2.4* 2.4*  --     Liver Function Tests: Recent Labs  Lab 01/03/2020 1800  AST 48*  ALT 53*  ALKPHOS 125  BILITOT 5.1*  PROT 5.6*  ALBUMIN 1.8*   No results for input(s): LIPASE, AMYLASE in the last 168 hours. No results for input(s): AMMONIA in the last 168 hours.  ABG    Component Value Date/Time   PHART 7.540 (H) 01/03/2020 0356   PCO2ART 23.2 (L) 01/03/2020 0356   PO2ART 176 (H) 01/03/2020 0356   HCO3 19.8 (L) 01/03/2020 0356   TCO2 21 (L) 01/03/2020 0356   ACIDBASEDEF 1.0 01/03/2020 0356   O2SAT 100.0 01/03/2020 0356     Coagulation Profile: Recent Labs  Lab 12/24/2019 1800  INR 1.3*    Cardiac Enzymes: No results for input(s): CKTOTAL, CKMB, CKMBINDEX, TROPONINI in the last 168 hours.  HbA1C: Hemoglobin A1C  Date/Time Value Ref Range Status  12/04/2019 11:02  AM 9.1 (A) 4.0 - 5.6 % Final  09/02/2019 11:06 AM 9.7 (A) 4.0 - 5.6 % Final   Hgb A1c MFr Bld  Date/Time Value Ref Range Status  01/07/20 06:00 PM 8.5 (H) 4.8 - 5.6 % Final    Comment:    (NOTE) Pre diabetes:          5.7%-6.4%  Diabetes:              >6.4%  Glycemic control for   <7.0% adults with diabetes     CBG: Recent Labs  Lab 01/07/2020 1521 01-07-2020 2006 01/03/20 0012 01/03/20 0758 01/03/20 1147  GLUCAP 231* 248* 202* 228* 214*    CRITICAL CARE Performed by: Lynnell Catalan   Total critical care time: 45 minutes  Critical care time was exclusive of separately billable procedures and treating other patients.  Critical care was necessary to treat or prevent imminent or life-threatening deterioration.  Critical care was time spent personally by me on the following activities: development of treatment plan with patient and/or surrogate as well as nursing, discussions with consultants, evaluation of patient's response to treatment, examination of patient, obtaining history from patient  or surrogate, ordering and performing treatments and interventions, ordering and review of laboratory studies, ordering and review of radiographic studies, pulse oximetry, re-evaluation of patient's condition and participation in multidisciplinary rounds.  Lynnell Catalan, MD [facility] ICU Physician [facility]  Critical Care  Pager: [phone] Mobile: [phone] After hours: [phone].   01/03/2020 12:49 PM

## 2020-01-03 NOTE — Progress Notes (Addendum)
LP under fluoro with minimal CSF output. Sent for c/s  EEG with generalized slowing  MR T- and L-spine ordered for tomorrow AM - initial complaint was back pain and need to r/o spinal/epidural abscess  -- Milon Dikes, MD [facility] Pager: [phone] If 7pm to 7am, please call on call as listed on AMION.

## 2020-01-04 ENCOUNTER — Inpatient Hospital Stay (HOSPITAL_COMMUNITY): Payer: Medicare Other

## 2020-01-04 DIAGNOSIS — G003 Staphylococcal meningitis: Secondary | ICD-10-CM

## 2020-01-04 DIAGNOSIS — E1129 Type 2 diabetes mellitus with other diabetic kidney complication: Secondary | ICD-10-CM

## 2020-01-04 LAB — CBC WITH DIFFERENTIAL/PLATELET
Abs Immature Granulocytes: 0.34 10*3/uL — ABNORMAL HIGH (ref 0.00–0.07)
Basophils Absolute: 0 10*3/uL (ref 0.0–0.1)
Basophils Relative: 0 %
Eosinophils Absolute: 0.1 10*3/uL (ref 0.0–0.5)
Eosinophils Relative: 1 %
HCT: 29.4 % — ABNORMAL LOW (ref 39.0–52.0)
Hemoglobin: 9.8 g/dL — ABNORMAL LOW (ref 13.0–17.0)
Immature Granulocytes: 3 %
Lymphocytes Relative: 9 %
Lymphs Abs: 1.2 10*3/uL (ref 0.7–4.0)
MCH: 33 pg (ref 26.0–34.0)
MCHC: 33.3 g/dL (ref 30.0–36.0)
MCV: 99 fL (ref 80.0–100.0)
Monocytes Absolute: 0.6 10*3/uL (ref 0.1–1.0)
Monocytes Relative: 4 %
Neutro Abs: 11.3 10*3/uL — ABNORMAL HIGH (ref 1.7–7.7)
Neutrophils Relative %: 83 %
Platelets: 79 10*3/uL — ABNORMAL LOW (ref 150–400)
RBC: 2.97 MIL/uL — ABNORMAL LOW (ref 4.22–5.81)
RDW: 14.5 % (ref 11.5–15.5)
WBC: 13.6 10*3/uL — ABNORMAL HIGH (ref 4.0–10.5)
nRBC: 0 % (ref 0.0–0.2)

## 2020-01-04 LAB — COMPREHENSIVE METABOLIC PANEL
ALT: 51 U/L — ABNORMAL HIGH (ref 0–44)
AST: 62 U/L — ABNORMAL HIGH (ref 15–41)
Albumin: 1.4 g/dL — ABNORMAL LOW (ref 3.5–5.0)
Alkaline Phosphatase: 139 U/L — ABNORMAL HIGH (ref 38–126)
Anion gap: 8 (ref 5–15)
BUN: 37 mg/dL — ABNORMAL HIGH (ref 8–23)
CO2: 19 mmol/L — ABNORMAL LOW (ref 22–32)
Calcium: 7.5 mg/dL — ABNORMAL LOW (ref 8.9–10.3)
Chloride: 113 mmol/L — ABNORMAL HIGH (ref 98–111)
Creatinine, Ser: 0.93 mg/dL (ref 0.61–1.24)
GFR calc Af Amer: >60 mL/min (ref >60)
GFR calc non Af Amer: >60 mL/min (ref >60)
Glucose, Bld: 316 mg/dL — ABNORMAL HIGH (ref 70–99)
Potassium: 3.3 mmol/L — ABNORMAL LOW (ref 3.5–5.1)
Sodium: 140 mmol/L (ref 135–145)
Total Bilirubin: 2.9 mg/dL — ABNORMAL HIGH (ref 0.3–1.2)
Total Protein: 5.1 g/dL — ABNORMAL LOW (ref 6.5–8.1)

## 2020-01-04 LAB — GLUCOSE, CAPILLARY
Glucose-Capillary: 221 mg/dL — ABNORMAL HIGH (ref 70–99)
Glucose-Capillary: 236 mg/dL — ABNORMAL HIGH (ref 70–99)
Glucose-Capillary: 251 mg/dL — ABNORMAL HIGH (ref 70–99)
Glucose-Capillary: 253 mg/dL — ABNORMAL HIGH (ref 70–99)
Glucose-Capillary: 260 mg/dL — ABNORMAL HIGH (ref 70–99)
Glucose-Capillary: 267 mg/dL — ABNORMAL HIGH (ref 70–99)
Glucose-Capillary: 274 mg/dL — ABNORMAL HIGH (ref 70–99)
Glucose-Capillary: 295 mg/dL — ABNORMAL HIGH (ref 70–99)
Glucose-Capillary: 395 mg/dL — ABNORMAL HIGH (ref 70–99)

## 2020-01-04 LAB — URINE CULTURE: Culture: NO GROWTH

## 2020-01-04 LAB — MAGNESIUM
Magnesium: 1.8 mg/dL (ref 1.7–2.4)
Magnesium: 1.8 mg/dL (ref 1.7–2.4)

## 2020-01-04 LAB — PHOSPHORUS
Phosphorus: 2.7 mg/dL (ref 2.5–4.6)
Phosphorus: 3.1 mg/dL (ref 2.5–4.6)

## 2020-01-04 LAB — TRIGLYCERIDES: Triglycerides: 244 mg/dL — ABNORMAL HIGH (ref <150)

## 2020-01-04 IMAGING — MR MR THORACIC SPINE WO/W CM
6 of 10 series · 23 of 48 positions shown · IV contrast (Gadavist)
Comparison: CT thoracic and lumbar spine [DATE]

CLINICAL DATA: Bacteremia. Concern for epidural abscess.

EXAM:
MRI THORACIC AND LUMBAR SPINE WITHOUT AND WITH CONTRAST
TECHNIQUE: Multiplanar and multiecho pulse sequences of the thoracic and lumbar
spine were obtained without and with intravenous contrast.
CONTRAST:  10mL GADAVIST GADOBUTROL 1 MMOL/ML IV SOLN

[Series 18: T1 · sagittal · 6.0mm · 1.23mm/px · 3 of 12 slices shown (1 of 3)]
[im 1/12]
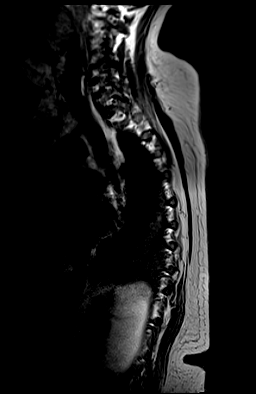
[im 6/12]
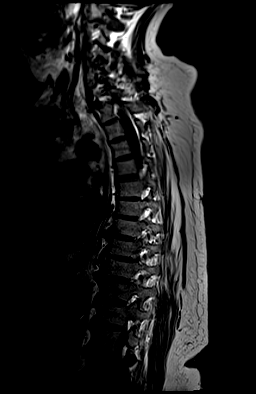
[im 12/12]
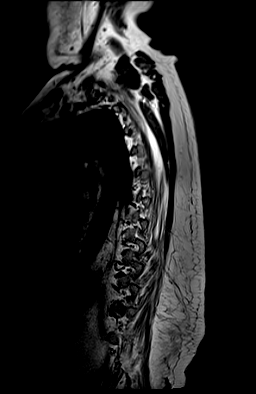

[Series 19: T2 · sagittal · 3.0mm · 0.76mm/px · 4 of 19 slices shown (1 of 2)]
[im 1/19]
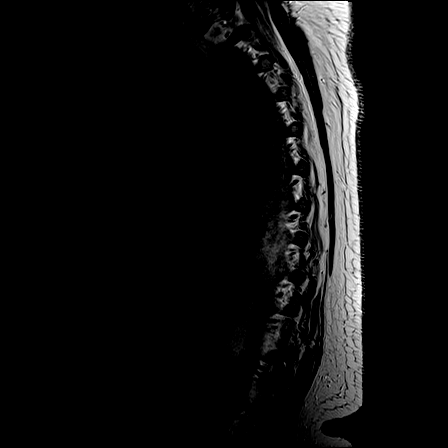
[im 7/19]
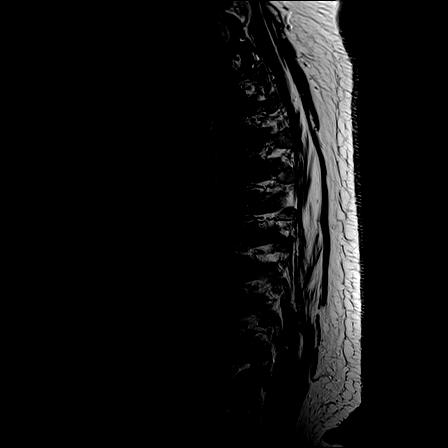
[im 13/19]
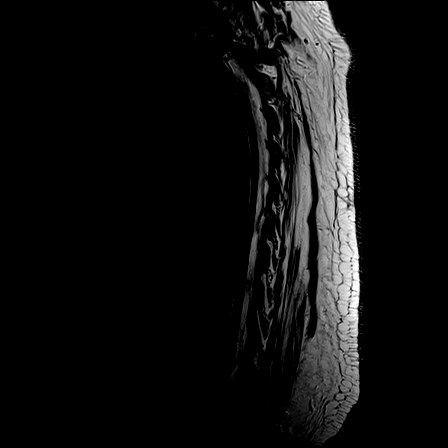
[im 19/19]
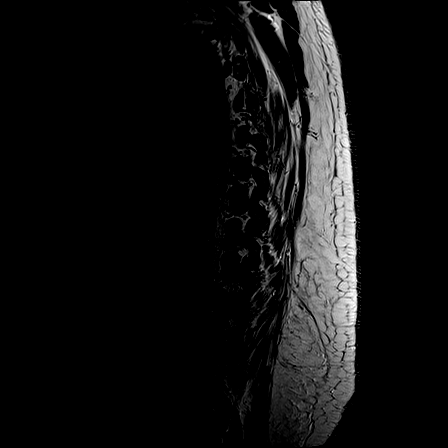

[Series 20: T1 · sagittal · 3.0mm · 0.76mm/px · 4 of 19 slices shown (2 of 3)]
[im 1/19]
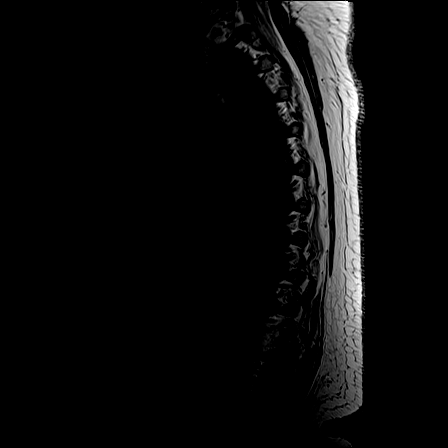
[im 7/19]
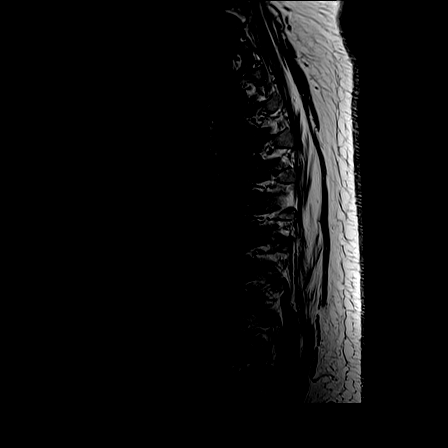
[im 13/19]
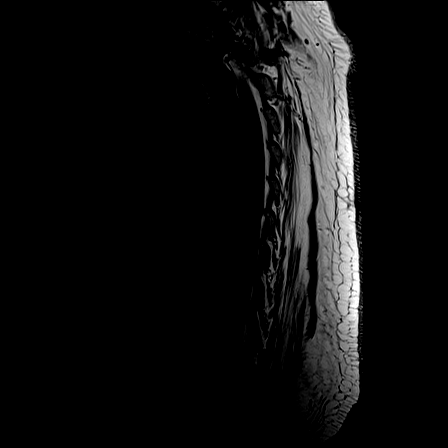
[im 19/19]
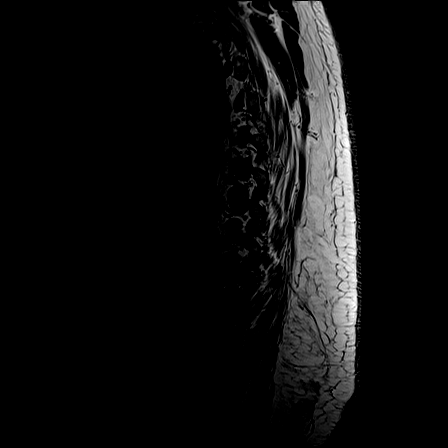

[Series 23: T1 · axial · non-contrast · 4.0mm · 0.31mm/px · z∈[-209,-143]mm · 2 of 39 slices shown (3 of 3)]
[im 1/39]
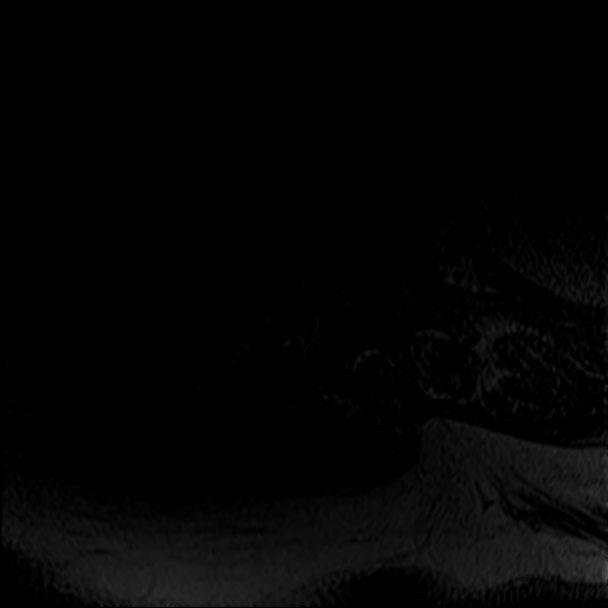
[im 7/39]
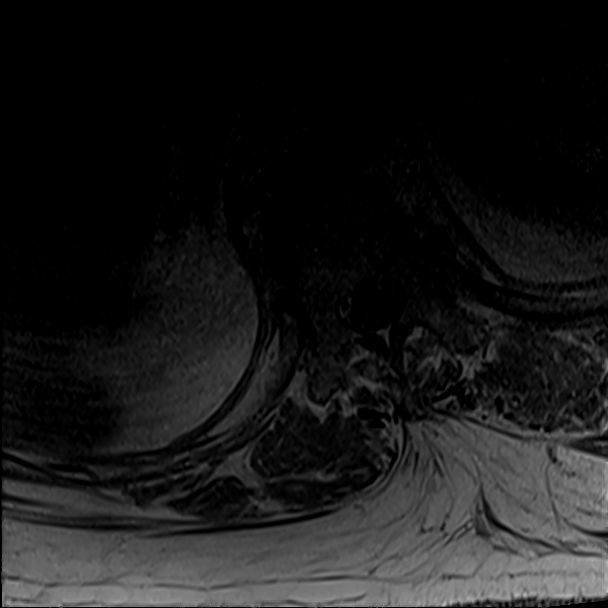

[Series 24: T2 · axial · 4.0mm · 0.59mm/px · z∈[-209,+52]mm · 7 of 39 slices shown (2 of 2)]
[im 1/39]
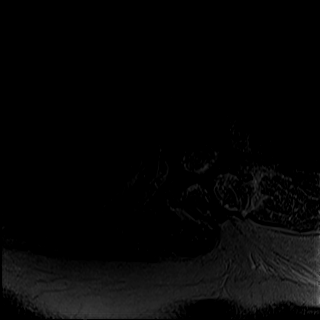
[im 7/39]
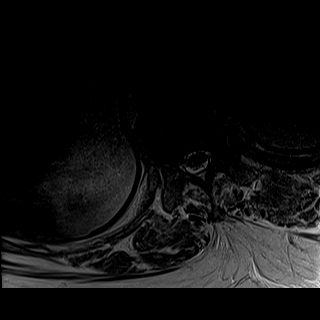
[im 13/39]
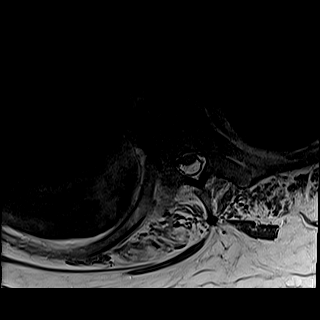
[im 20/39]
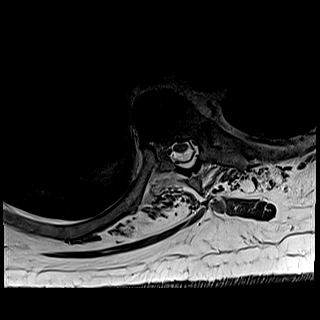
[im 26/39]
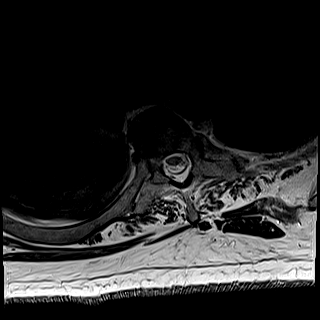
[im 32/39]
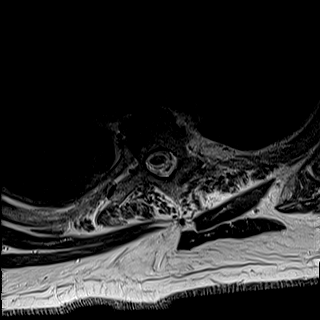
[im 39/39]
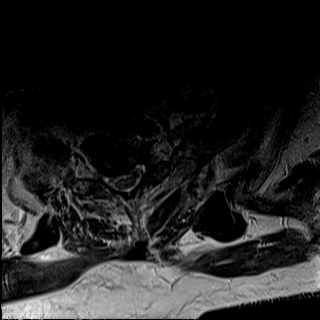

[Series 34: T2 post-contrast · sagittal · 3.0mm · 0.76mm/px · 3 of 19 slices shown]
[im 1/19]
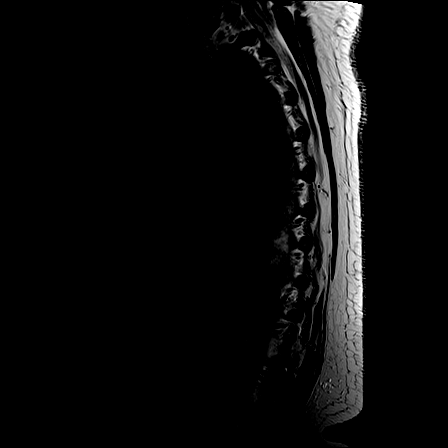
[im 10/19]
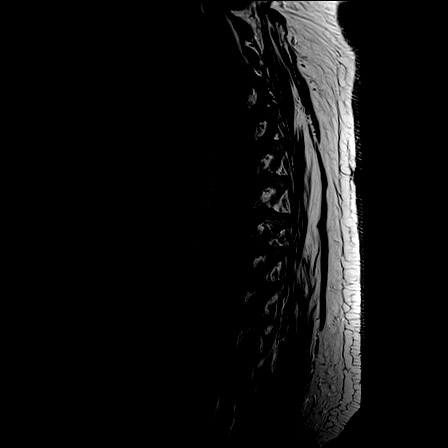
[im 19/19]
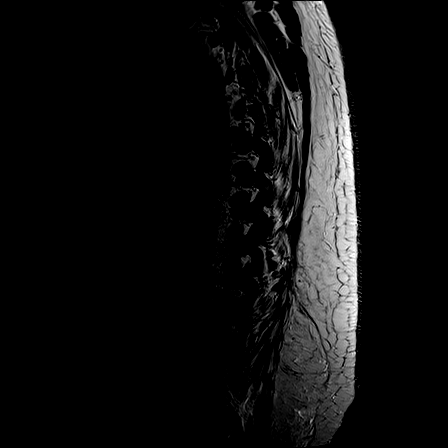

[23 of 48 positions shown; findings below may reference images not displayed]

FINDINGS: MRI THORACIC SPINE FINDINGS

Alignment:  No listhesis.

Vertebrae: No fracture or suspicious osseous lesion. Spondylosis
including bulky anterior vertebral osteophytes in the mid and lower
thoracic spine. Low level endplate edema anteriorly at T7-8
associated with a large osteophyte with small amount of intervening
fluid signal corresponding to gas on the recent CT and considered
degenerative. No definite evidence of discitis.

Cord: Normal cord signal although decreased signal to noise mildly
limits assessment at T11-12. Increased epidural enhancement dorsally
in the midthoracic spine and ventrally and dorsally in the lower
thoracic spine without a discrete epidural fluid collection.
Suspected loculated CSF signal intensity subarachnoid or subdural
fluid collections ventrally and dorsally at T11-12 as also noted on
the lumbar study, and with a suspected dorsal collection extending
superiorly in the thoracic spine to at least the T7-8 level given
mild anterior cord displacement.

Paraspinal and other soft tissues: No paraspinal fluid collection.
Trace pleural effusions.

Disc levels:

Small central disc protrusions at T5-6, T6-7, and T7-8, and a small
left paracentral disc protrusion at T8-9 without significant
stenosis. Subtle cord flattening at T7-8.

MRI LUMBAR SPINE FINDINGS

Image quality is degraded by decreased signal to noise as well as
moderate motion artifact on postcontrast images.

Segmentation: Standard.

Alignment:  Minimal retrolisthesis of L2 on L3 and L3 on L4.

Vertebrae: Preserved vertebral body heights without evidence of a
fracture. There are left larger than right facet joint effusions at
L4-5 with prominent surrounding marrow edema and enhancement
extending into the soft tissues with fluid collections posterior to
the facet joints measuring 3.3 cm on the right and 1.8 cm on the
left. There is heterogeneously T2 hyperintense epidural material at
L4-5 primarily on the right with ill-defined enhancement which
measures up to 6 mm in thickness and displaces the thecal sac, and
there is also thin T2/STIR hyperintense dorsal epidural material
which extends superiorly to L3 and inferiorly to S1 although a
discrete, drainable fluid collection is not identified. Fluid signal
in the L2-3 and L3-4 disc spaces is without associated endplate
destruction or enhancement and is favored to be degenerative rather
than infectious with a small amount of nitrogen gas in the disc
spaces on recent CT which also favors a degenerative etiology.

Conus medullaris: Extends to the T12-L1 level. There is an abnormal
appearance of the thecal sac at T12 and L1 with suggestion of CSF
signal intensity fluid collections (potentially subdural or
loculated subarachnoid collections) which result in displacement of
the conus medullaris and central displacement of the cauda equina
nerve roots without definite associated enhancement.

Paraspinal and other soft tissues: Edema within the posterior
paraspinal musculature with fluid collections as detailed above.

Disc levels:

L1-2: Disc desiccation and mild disc space narrowing.
Circumferential disc bulging and asymmetric right facet and
ligamentum flavum hypertrophy, congenitally short pedicles, and
prominent epidural fat result in mild-to-moderate spinal stenosis
without significant neural foraminal stenosis.

L2-3: Moderate disc space narrowing. Circumferential disc bulging,
congenitally short pedicles, prominent epidural fat, and mild facet
and ligamentum flavum hypertrophy result in moderate spinal stenosis
and mild right and mild-to-moderate left neural foraminal stenosis.

L3-4: Moderate disc space narrowing. Circumferential disc bulging, a
suspected small central disc extrusion with caudal migration to the
mid L4 vertebral body level, congenitally short pedicles, prominent
epidural fat, and moderate facet and ligamentum flavum hypertrophy
result in severe spinal stenosis and severe bilateral neural
foraminal stenosis.

L4-5: Disc desiccation. Circumferential disc bulging, congenitally
short pedicles, prominent epidural fat, the above described
inflammatory epidural process, and moderate to severe facet and
ligamentum flavum hypertrophy result in moderate to severe spinal
stenosis and moderate right and severe left neural foraminal
stenosis.

L5-S1: Disc desiccation. Minimal disc bulging and mild right and
moderate left facet hypertrophy without significant stenosis. Small
left facet joint effusion without significant marrow edema to
specifically suggest septic arthritis.
IMPRESSION: 1. Suspected bilateral septic facet arthritis at L4-5 with
associated fluid collections/abscesses posterior to the facet
joints.
2. Epidural inflammation/phlegmon from L3-S1 without a discrete
lumbar epidural abscess.
3. Abnormal epidural enhancement in the lower and midthoracic spine
also likely related to spinal infection without evidence of an
epidural abscess.
4. Suspected CSF signal intensity fluid collections extending from
the upper lumbar spine to the midthoracic spine, possibly reactive
subdural effusions or loculated subarachnoid collections related to
meningitis but without peripheral enhancement to suggest abscesses.
5. No definite evidence of discitis.
6. Congenitally short pedicles with superimposed lumbar disc and
facet degeneration as above. Severe spinal stenosis and severe
bilateral neural foraminal stenosis at L3-4.
7. Moderate to severe spinal stenosis and moderate right and severe
left neural foraminal stenosis at L4-5.

Preliminary findings of septic facet arthritis and suspected
intraspinal infection were discussed with Dr. KLEVER by telephone at
on [DATE] at [DATE] p.m.

## 2020-01-04 IMAGING — MR MR LUMBAR SPINE WO/W CM
4 of 7 series · 21 of 48 positions shown · IV contrast (gadavist)
Comparison: CT thoracic and lumbar spine [DATE]

CLINICAL DATA: Bacteremia. Concern for epidural abscess.

EXAM:
MRI THORACIC AND LUMBAR SPINE WITHOUT AND WITH CONTRAST
TECHNIQUE: Multiplanar and multiecho pulse sequences of the thoracic and lumbar
spine were obtained without and with intravenous contrast.
CONTRAST:  10mL GADAVIST GADOBUTROL 1 MMOL/ML IV SOLN

[Series 25: T1 · sagittal · 4.0mm · 0.88mm/px · 4 of 17 slices shown (1 of 2)]
[im 1/17]
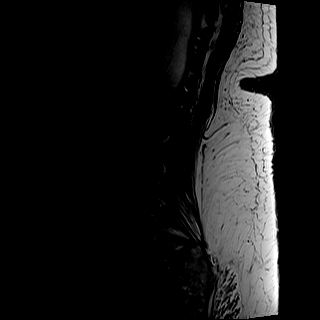
[im 6/17]
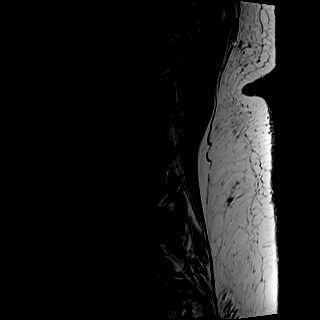
[im 11/17]
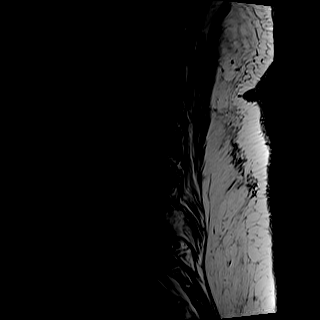
[im 17/17]
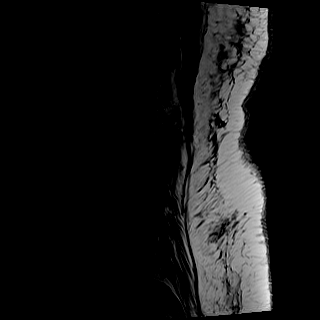

[Series 27: T2 · sagittal · non-contrast · 4.0mm · 0.73mm/px · 5 of 17 slices shown (1 of 2)]
[im 1/17]
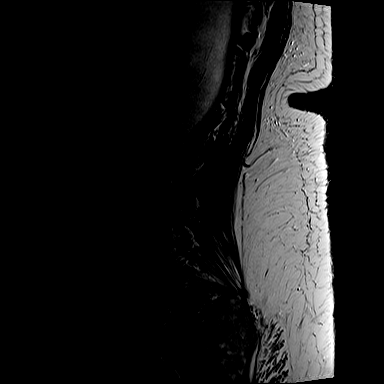
[im 5/17]
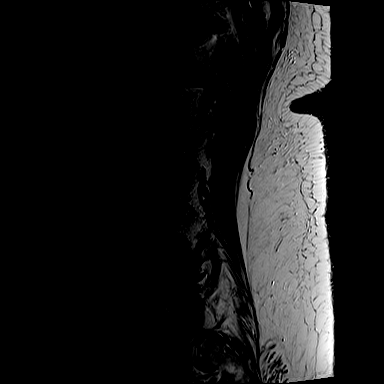
[im 9/17]
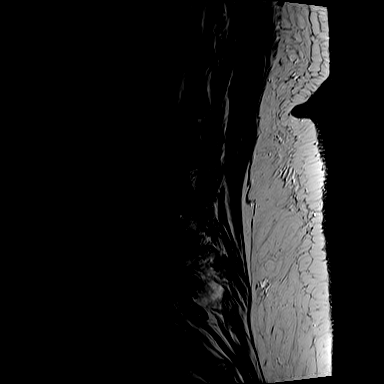
[im 13/17]
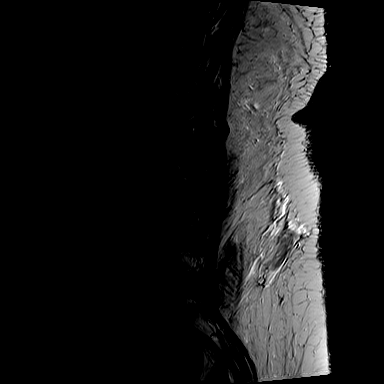
[im 17/17]
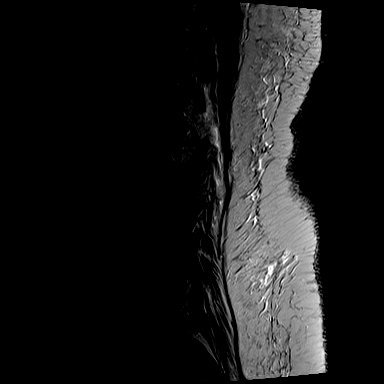

[Series 28: T2 · axial · 4.0mm · 0.57mm/px · z∈[-406,-202]mm · 8 of 35 slices shown (2 of 2)]
[im 1/35]
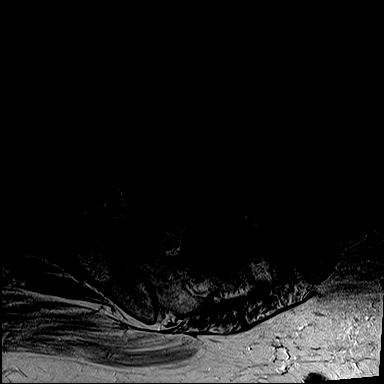
[im 4/35]
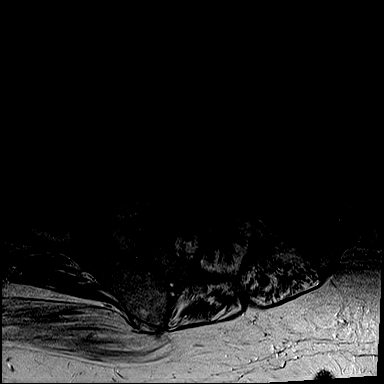
[im 12/35]
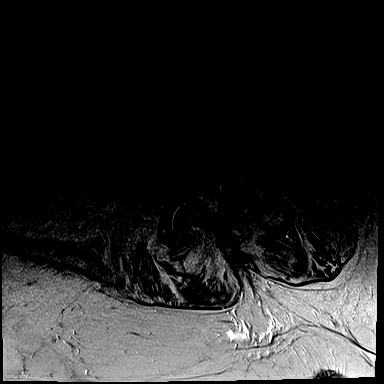
[im 16/35]
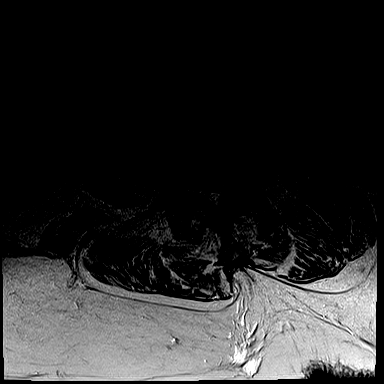
[im 19/35]
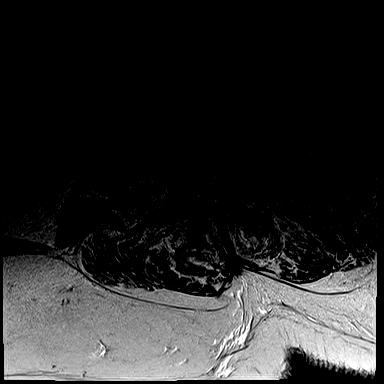
[im 23/35]
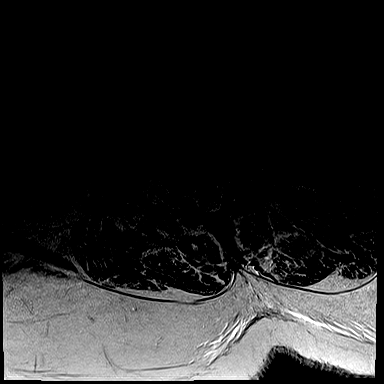
[im 31/35]
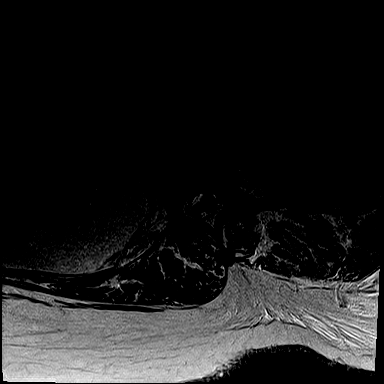
[im 35/35]
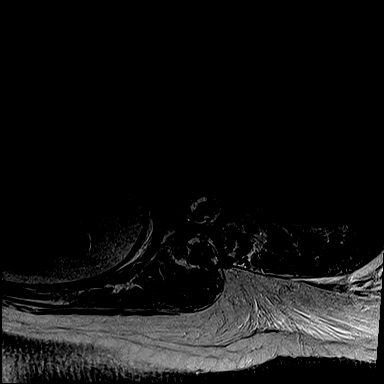

[Series 29: T1 · axial · 4.0mm · 0.34mm/px · z∈[-406,-226]mm · 4 of 35 slices shown (2 of 2)]
[im 1/35]
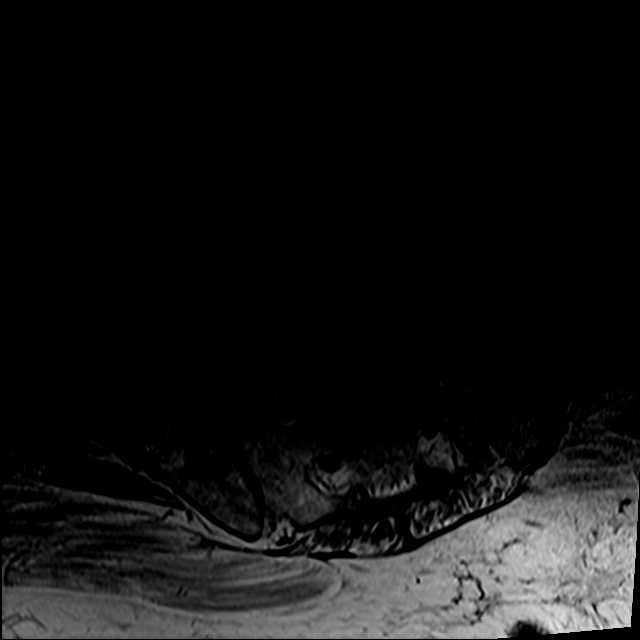
[im 4/35]
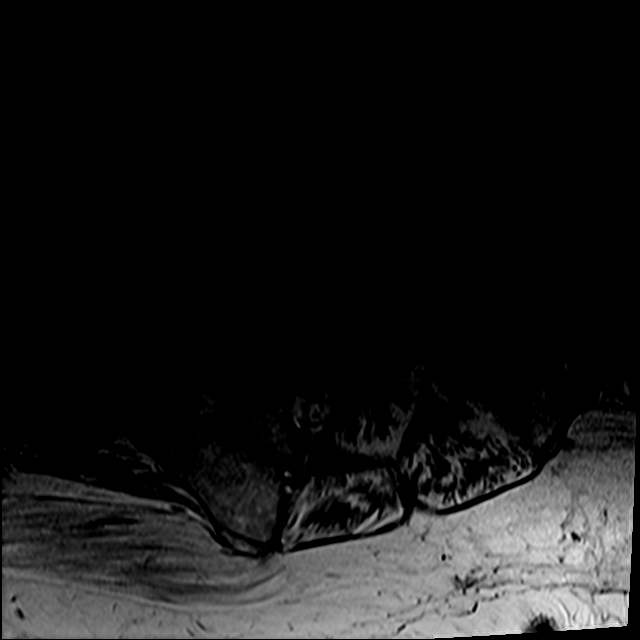
[im 19/35]
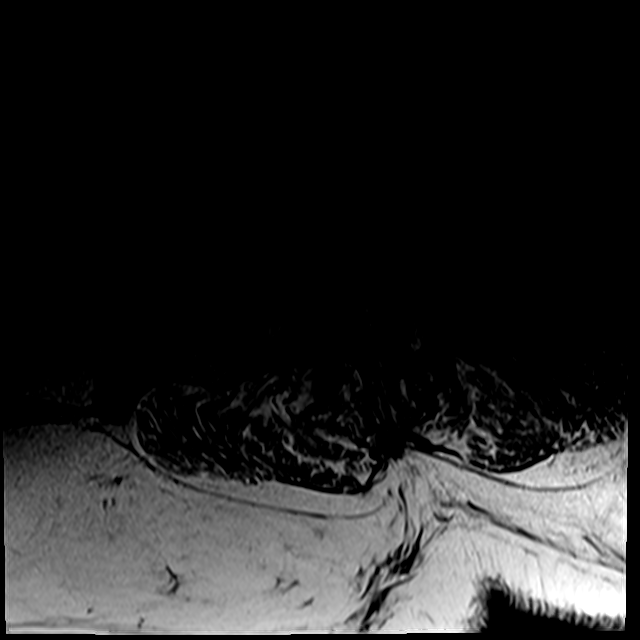
[im 31/35]
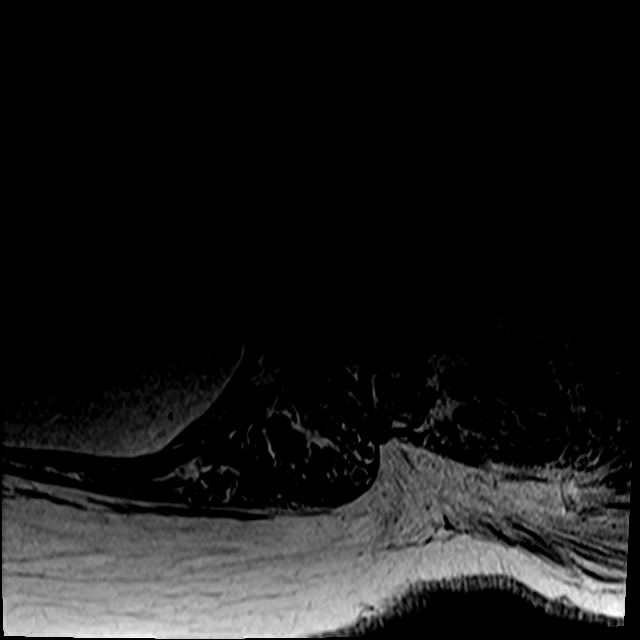

[21 of 48 positions shown; findings below may reference images not displayed]

FINDINGS: MRI THORACIC SPINE FINDINGS

Alignment:  No listhesis.

Vertebrae: No fracture or suspicious osseous lesion. Spondylosis
including bulky anterior vertebral osteophytes in the mid and lower
thoracic spine. Low level endplate edema anteriorly at T7-8
associated with a large osteophyte with small amount of intervening
fluid signal corresponding to gas on the recent CT and considered
degenerative. No definite evidence of discitis.

Cord: Normal cord signal although decreased signal to noise mildly
limits assessment at T11-12. Increased epidural enhancement dorsally
in the midthoracic spine and ventrally and dorsally in the lower
thoracic spine without a discrete epidural fluid collection.
Suspected loculated CSF signal intensity subarachnoid or subdural
fluid collections ventrally and dorsally at T11-12 as also noted on
the lumbar study, and with a suspected dorsal collection extending
superiorly in the thoracic spine to at least the T7-8 level given
mild anterior cord displacement.

Paraspinal and other soft tissues: No paraspinal fluid collection.
Trace pleural effusions.

Disc levels:

Small central disc protrusions at T5-6, T6-7, and T7-8, and a small
left paracentral disc protrusion at T8-9 without significant
stenosis. Subtle cord flattening at T7-8.

MRI LUMBAR SPINE FINDINGS

Image quality is degraded by decreased signal to noise as well as
moderate motion artifact on postcontrast images.

Segmentation: Standard.

Alignment:  Minimal retrolisthesis of L2 on L3 and L3 on L4.

Vertebrae: Preserved vertebral body heights without evidence of a
fracture. There are left larger than right facet joint effusions at
L4-5 with prominent surrounding marrow edema and enhancement
extending into the soft tissues with fluid collections posterior to
the facet joints measuring 3.3 cm on the right and 1.8 cm on the
left. There is heterogeneously T2 hyperintense epidural material at
L4-5 primarily on the right with ill-defined enhancement which
measures up to 6 mm in thickness and displaces the thecal sac, and
there is also thin T2/STIR hyperintense dorsal epidural material
which extends superiorly to L3 and inferiorly to S1 although a
discrete, drainable fluid collection is not identified. Fluid signal
in the L2-3 and L3-4 disc spaces is without associated endplate
destruction or enhancement and is favored to be degenerative rather
than infectious with a small amount of nitrogen gas in the disc
spaces on recent CT which also favors a degenerative etiology.

Conus medullaris: Extends to the T12-L1 level. There is an abnormal
appearance of the thecal sac at T12 and L1 with suggestion of CSF
signal intensity fluid collections (potentially subdural or
loculated subarachnoid collections) which result in displacement of
the conus medullaris and central displacement of the cauda equina
nerve roots without definite associated enhancement.

Paraspinal and other soft tissues: Edema within the posterior
paraspinal musculature with fluid collections as detailed above.

Disc levels:

L1-2: Disc desiccation and mild disc space narrowing.
Circumferential disc bulging and asymmetric right facet and
ligamentum flavum hypertrophy, congenitally short pedicles, and
prominent epidural fat result in mild-to-moderate spinal stenosis
without significant neural foraminal stenosis.

L2-3: Moderate disc space narrowing. Circumferential disc bulging,
congenitally short pedicles, prominent epidural fat, and mild facet
and ligamentum flavum hypertrophy result in moderate spinal stenosis
and mild right and mild-to-moderate left neural foraminal stenosis.

L3-4: Moderate disc space narrowing. Circumferential disc bulging, a
suspected small central disc extrusion with caudal migration to the
mid L4 vertebral body level, congenitally short pedicles, prominent
epidural fat, and moderate facet and ligamentum flavum hypertrophy
result in severe spinal stenosis and severe bilateral neural
foraminal stenosis.

L4-5: Disc desiccation. Circumferential disc bulging, congenitally
short pedicles, prominent epidural fat, the above described
inflammatory epidural process, and moderate to severe facet and
ligamentum flavum hypertrophy result in moderate to severe spinal
stenosis and moderate right and severe left neural foraminal
stenosis.

L5-S1: Disc desiccation. Minimal disc bulging and mild right and
moderate left facet hypertrophy without significant stenosis. Small
left facet joint effusion without significant marrow edema to
specifically suggest septic arthritis.
IMPRESSION: 1. Suspected bilateral septic facet arthritis at L4-5 with
associated fluid collections/abscesses posterior to the facet
joints.
2. Epidural inflammation/phlegmon from L3-S1 without a discrete
lumbar epidural abscess.
3. Abnormal epidural enhancement in the lower and midthoracic spine
also likely related to spinal infection without evidence of an
epidural abscess.
4. Suspected CSF signal intensity fluid collections extending from
the upper lumbar spine to the midthoracic spine, possibly reactive
subdural effusions or loculated subarachnoid collections related to
meningitis but without peripheral enhancement to suggest abscesses.
5. No definite evidence of discitis.
6. Congenitally short pedicles with superimposed lumbar disc and
facet degeneration as above. Severe spinal stenosis and severe
bilateral neural foraminal stenosis at L3-4.
7. Moderate to severe spinal stenosis and moderate right and severe
left neural foraminal stenosis at L4-5.

Preliminary findings of septic facet arthritis and suspected
intraspinal infection were discussed with Dr. KLEVER by telephone at
on [DATE] at [DATE] p.m.

## 2020-01-04 MED ORDER — SODIUM CHLORIDE 0.9% FLUSH: 10.0000 mL | INTRAVENOUS | Status: DC | PRN

## 2020-01-04 MED ORDER — GADOBUTROL 1 MMOL/ML IV SOLN
10.0000 mL | Freq: Once | INTRAVENOUS | Status: AC | PRN
  Administered 2020-01-04: 10 mL via INTRAVENOUS

## 2020-01-04 MED ORDER — SODIUM CHLORIDE 0.9% FLUSH
10.0000 mL | Freq: Two times a day (BID) | INTRAVENOUS | Status: DC
  Administered 2020-01-04 – 2020-01-07 (×5): 10 mL

## 2020-01-04 MED ORDER — INSULIN DETEMIR 100 UNIT/ML ~~LOC~~ SOLN
10.0000 [IU] | Freq: Every day | SUBCUTANEOUS | Status: DC
  Administered 2020-01-04 (×2): 10 [IU] via SUBCUTANEOUS
  Filled 2020-01-04 (×3): qty 0.1

## 2020-01-04 NOTE — Consult Note (Signed)
[facility]    Date of Admission:  12/25/2019   Total days of antibiotics: 3 MCHS               Reason for Consult: meningitis    Referring Provider: Rory Percy   Assessment: MRSA meningitis, bacteremia DM2  Plan: 1. Continue vancomcycin, appreciate pharm dosing 2. Stop cefepime 3. Await MRI of spine 4. Needs TEE when CV comfortable with his PLT count 5. Place PIC when his MCHS BCx are (-). 6-18 ngtd.   Comment- Not clear what his primary source is. Await further imaging to determine next steps in therapy/intervention.   Thank you so much for this interesting consult,  Active Problems:   Bacteremia   Encounter for intubation   . amiodarone  150 mg Intravenous Once  . chlorhexidine gluconate (MEDLINE KIT)  15 mL Mouth Rinse BID  . Chlorhexidine Gluconate Cloth  6 each Topical Daily  . docusate  100 mg Per Tube BID  . feeding supplement (PRO-STAT SUGAR FREE 64)  30 mL Per Tube BID  . feeding supplement (VITAL HIGH PROTEIN)  1,000 mL Per Tube Q24H  . fentaNYL (SUBLIMAZE) injection  25 mcg Intravenous Once  . heparin injection (subcutaneous)  5,000 Units Subcutaneous Q8H  . insulin aspart  0-15 Units Subcutaneous Q4H  . insulin detemir  10 Units Subcutaneous QHS  . mouth rinse  15 mL Mouth Rinse 10 times per day  . pantoprazole (PROTONIX) IV  40 mg Intravenous QHS  . polyethylene glycol  17 g Per Tube Daily  . potassium chloride  40 mEq Oral BID    HPI: Isaac Hamilton is a 69 y.o. male with hx of DM2, afib, initially adm to [facility] 6-13  with back pain after helping move a scooter. He was found to have MRSA bacteremia (2/4 6-17) and his in hospital course was complicated by thrombocytopenia, transaminitis, encephalopathy. He was treated with vancomycin. A tee was not obtained due to his low platelets.  He was transferred to [facility] on 6-18 with L sided weakness and was unresponsive. He was intubated. He underwent MRI head and was  felt to have debris in his ventricles consistent with infection.  He had LP attempted and only a small amt of pus was able to be withdrawn (sent for cx, growing staph).   His course has been further complicated by afib with RVR ( on amio and dilt).   Review of Systems: Review of Systems  Unable to perform ROS: Intubated  as per HPI  Past Medical History:  Diagnosis Date  . Diabetes mellitus (Frank)   . Dyslipidemia     Social History   Tobacco Use  . Smoking status: Never Smoker  . Smokeless tobacco: Never Used  Substance Use Topics  . Alcohol use: Never  . Drug use: Not on file    Family History  Problem Relation Age of Onset  . Diabetes Mother   . Diabetes Sister      Medications:  Scheduled: . amiodarone  150 mg Intravenous Once  . chlorhexidine gluconate (MEDLINE KIT)  15 mL Mouth Rinse BID  . Chlorhexidine Gluconate Cloth  6 each Topical Daily  . docusate  100 mg Per Tube BID  . feeding supplement (PRO-STAT SUGAR FREE 64)  30 mL Per Tube BID  . feeding supplement (VITAL HIGH PROTEIN)  1,000 mL Per Tube Q24H  . fentaNYL (SUBLIMAZE) injection  25 mcg Intravenous Once  .  heparin injection (subcutaneous)  5,000 Units Subcutaneous Q8H  . insulin aspart  0-15 Units Subcutaneous Q4H  . insulin detemir  10 Units Subcutaneous QHS  . mouth rinse  15 mL Mouth Rinse 10 times per day  . pantoprazole (PROTONIX) IV  40 mg Intravenous QHS  . polyethylene glycol  17 g Per Tube Daily  . potassium chloride  40 mEq Oral BID    Abtx:  Anti-infectives (From admission, onward)   Start     Dose/Rate Route Frequency Ordered Stop   01/04/20 0800  vancomycin (VANCOREADY) IVPB 1500 mg/300 mL     Discontinue     1,500 mg 150 mL/hr over 120 Minutes Intravenous Every 12 hours 01/03/20 1916     01/03/20 0630  vancomycin (VANCOREADY) IVPB 2000 mg/400 mL  Status:  Discontinued        2,000 mg 200 mL/hr over 120 Minutes Intravenous Every 12 hours 12/24/2019 1918 01/03/20 1916   12/31/2019 2015   ampicillin (OMNIPEN) 2 g in sodium chloride 0.9 % 100 mL IVPB  Status:  Discontinued        2 g 300 mL/hr over 20 Minutes Intravenous Every 4 hours 12/21/2019 1920 01/04/20 0717   01/07/2020 1930  ampicillin (OMNIPEN) 2 g in sodium chloride 0.9 % 100 mL IVPB  Status:  Discontinued        2 g 300 mL/hr over 20 Minutes Intravenous  Once 12/21/2019 1823 12/19/2019 1920   12/25/2019 1900  ampicillin (OMNIPEN) 2 g in sodium chloride 0.9 % 100 mL IVPB  Status:  Discontinued        2 g 300 mL/hr over 20 Minutes Intravenous  Once 12/28/2019 1801 01/05/2020 1823   01/01/2020 1900  cefTRIAXone (ROCEPHIN) 2 g in sodium chloride 0.9 % 100 mL IVPB  Status:  Discontinued        2 g 200 mL/hr over 30 Minutes Intravenous Every 12 hours 12/26/2019 1822 01/04/20 0717   01/14/2020 1830  ampicillin (OMNIPEN) 2 g in sodium chloride 0.9 % 100 mL IVPB  Status:  Discontinued        2 g 300 mL/hr over 20 Minutes Intravenous  Once 01/13/2020 1741 12/31/2019 1801   01/13/2020 1800  ceFEPIme (MAXIPIME) 2 g in sodium chloride 0.9 % 100 mL IVPB  Status:  Discontinued        2 g 200 mL/hr over 30 Minutes Intravenous  Once 12/16/2019 1733 12/31/2019 1821   01/12/2020 1800  vancomycin (VANCOREADY) IVPB 2000 mg/400 mL        2,000 mg 200 mL/hr over 120 Minutes Intravenous  Once 12/25/2019 1742 01/03/20 1900        OBJECTIVE: Blood pressure (!) 127/53, pulse (!) 104, temperature 100 F (37.8 C), resp. rate (!) 23, height 5' 10" (1.778 m), weight (!) 151 kg, SpO2 99 %.  Physical Exam Constitutional:      General: He is not in acute distress.    Appearance: He is obese. He is ill-appearing.  Eyes:     Pupils: Pupils are equal, round, and reactive to light.  Cardiovascular:     Rate and Rhythm: Tachycardia present.  Pulmonary:     Breath sounds: Normal breath sounds.  Abdominal:     General: Bowel sounds are normal. There is no distension.     Palpations: Abdomen is soft.     Tenderness: There is no abdominal tenderness.  Musculoskeletal:      Cervical back: Neck supple.     Right lower leg: Edema present.  Left lower leg: Edema present.   unable to do neuro/psych exam due to sedation/intubation.   Lab Results Results for orders placed or performed during the hospital encounter of 12/27/2019 (from the past 48 hour(s))  Glucose, capillary     Status: Abnormal   Collection Time: 01/05/2020  3:21 PM  Result Value Ref Range   Glucose-Capillary 231 (H) 70 - 99 mg/dL    Comment: Glucose reference range applies only to samples taken after fasting for at least 8 hours.  Blood gas, arterial     Status: Abnormal   Collection Time: 12/29/2019  3:42 PM  Result Value Ref Range   FIO2 32.00    pH, Arterial 7.529 (H) 7.35 - 7.45   pCO2 arterial 24.0 (L) 32 - 48 mmHg   pO2, Arterial 129 (H) 83 - 108 mmHg   Bicarbonate 19.9 (L) 20.0 - 28.0 mmol/L   Acid-base deficit 2.5 (H) 0.0 - 2.0 mmol/L   O2 Saturation 99.3 %   Patient temperature 37.0    Collection site RIGHT RADIAL    Drawn by [id number]    Sample type ARTERIAL DRAW    Allens test (pass/fail) PASS PASS    Comment: Performed at [facility], [place] [address] [place], [place] [address]  Urine culture     Status: None   Collection Time: 12/26/2019  4:10 PM   Specimen: Urine, Random  Result Value Ref Range   Specimen Description URINE, RANDOM    Special Requests NONE    Culture      NO GROWTH Performed at [facility], [place] [address], [place], [place] [address]    Report Status 01/04/2020 FINAL   HIV Antibody (routine testing w rflx)     Status: None   Collection Time: 12/25/2019  6:00 PM  Result Value Ref Range   HIV Screen 4th Generation wRfx Non Reactive Non Reactive    Comment: Performed at [facility], [place] [address], [place], [place] [address]  Lactic acid, plasma     Status: Abnormal   Collection Time: 01/01/2020  6:00 PM  Result Value Ref Range   Lactic Acid, Venous 2.4 (HH) 0.5 - 1.9 mmol/L    Comment: CRITICAL RESULT CALLED TO, READ BACK BY AND VERIFIED  WITHEverardo All RN [id number] Sander Radon Performed at [facility], [place] [address] [place], [place] [address]   Brain natriuretic peptide     Status: Abnormal   Collection Time: 01/12/2020  6:00 PM  Result Value Ref Range   B Natriuretic Peptide 330.4 (H) 0.0 - 100.0 pg/mL    Comment: Performed at [facility] [address], [place], [place] [address]  CBC WITH DIFFERENTIAL     Status: Abnormal   Collection Time: 12/19/2019  6:00 PM  Result Value Ref Range   WBC 23.4 (H) 4.0 - 10.5 K/uL    Comment: REPEATED TO VERIFY WHITE COUNT CONFIRMED ON SMEAR    RBC 3.68 (L) 4.22 - 5.81 MIL/uL   Hemoglobin 12.3 (L) 13.0 - 17.0 g/dL   HCT 35.5 (L) 39 - 52 %   MCV 96.5 80.0 - 100.0 fL   MCH 33.4 26.0 - 34.0 pg   MCHC 34.6 30.0 - 36.0 g/dL   RDW 13.6 11.5 - 15.5 %   Platelets ACLMP 150 - 400 K/uL    Comment: Immature Platelet Fraction may be clinically indicated, consider ordering this additional test [id number] PLATELET COUNT CONFIRMED BY SMEAR REPEATED TO  VERIFY    nRBC 0.1 0.0 - 0.2 %   Neutrophils Relative % 84 %   Neutro Abs 19.5 (H) 1.7 - 7.7 K/uL   Lymphocytes Relative 7 %   Lymphs Abs 1.5 0.7 - 4.0 K/uL   Monocytes Relative 4 %   Monocytes Absolute 0.9 0 - 1 K/uL   Eosinophils Relative 0 %   Eosinophils Absolute 0.1 0 - 0 K/uL   Basophils Relative 0 %   Basophils Absolute 0.1 0 - 0 K/uL   Immature Granulocytes 5 %   Abs Immature Granulocytes 1.26 (H) 0.00 - 0.07 K/uL    Comment: Performed at [facility] [address] [place], [place] [address]  Protime-INR     Status: Abnormal   Collection Time: 12/16/2019  6:00 PM  Result Value Ref Range   Prothrombin Time 15.7 (H) 11.4 - 15.2 seconds   INR 1.3 (H) 0.8 - 1.2    Comment: (NOTE) INR goal varies based on device and disease states. Performed at [facility], [place] [address] [place], [place] [address]   Hemoglobin A1c     Status: Abnormal   Collection Time: 12/30/2019  6:00 PM  Result Value Ref  Range   Hgb A1c MFr Bld 8.5 (H) 4.8 - 5.6 %    Comment: (NOTE) Pre diabetes:          5.7%-6.4%  Diabetes:              >6.4%  Glycemic control for   <7.0% adults with diabetes    Mean Plasma Glucose 197.25 mg/dL    Comment: Performed at [facility] [address], [place], [place] [address]  Comprehensive metabolic panel     Status: Abnormal   Collection Time: 12/30/2019  6:00 PM  Result Value Ref Range   Sodium 141 135 - 145 mmol/L   Potassium 3.4 (L) 3.5 - 5.1 mmol/L   Chloride 112 (H) 98 - 111 mmol/L   CO2 20 (L) 22 - 32 mmol/L   Glucose, Bld 263 (H) 70 - 99 mg/dL    Comment: Glucose reference range applies only to samples taken after fasting for at least 8 hours.   BUN 31 (H) 8 - 23 mg/dL   Creatinine, Ser 0.87 0.61 - 1.24 mg/dL   Calcium 8.0 (L) 8.9 - 10.3 mg/dL   Total Protein 5.6 (L) 6.5 - 8.1 g/dL   Albumin 1.8 (L) 3.5 - 5.0 g/dL   AST 48 (H) 15 - 41 U/L   ALT 53 (H) 0 - 44 U/L   Alkaline Phosphatase 125 38 - 126 U/L   Total Bilirubin 5.1 (H) 0.3 - 1.2 mg/dL   GFR calc non Af Amer >60 >60 mL/min   GFR calc Af Amer >60 >60 mL/min   Anion gap 9 5 - 15    Comment: Performed at [facility] [address], [place], [place] [address]  Magnesium     Status: None   Collection Time: 01/06/2020  6:00 PM  Result Value Ref Range   Magnesium 1.7 1.7 - 2.4 mg/dL    Comment: Performed at [facility], [place] [address], [place], [place] [address]  Procalcitonin     Status: None   Collection Time: 01/08/2020  6:00 PM  Result Value Ref Range   Procalcitonin 0.86 ng/mL    Comment:        Interpretation: PCT > 0.5 ng/mL and <= 2 ng/mL: Systemic infection (sepsis)  is possible, but other conditions are known to elevate PCT as well. (NOTE)       Sepsis PCT Algorithm           Lower Respiratory Tract                                      Infection PCT Algorithm    ----------------------------     ----------------------------         PCT < 0.25 ng/mL                 PCT < 0.10 ng/mL          Strongly encourage             Strongly discourage   discontinuation of antibiotics    initiation of antibiotics    ----------------------------     -----------------------------       PCT 0.25 - 0.50 ng/mL            PCT 0.10 - 0.25 ng/mL               OR       >80% decrease in PCT            Discourage initiation of                                            antibiotics      Encourage discontinuation           of antibiotics    ----------------------------     -----------------------------         PCT >= 0.50 ng/mL              PCT 0.26 - 0.50 ng/mL                AND       <80% decrease in PCT             Encourage initiation of                                             antibiotics       Encourage continuation           of antibiotics    ----------------------------     -----------------------------        PCT >= 0.50 ng/mL                  PCT > 0.50 ng/mL               AND         increase in PCT                  Strongly encourage                                      initiation of antibiotics    Strongly encourage escalation           of antibiotics                                     -----------------------------  PCT <= 0.25 ng/mL                                                 OR                                        > 80% decrease in PCT                                      Discontinue / Do not initiate                                             antibiotics  Performed at [facility], [place] [address] [place], [place] [address]   Phosphorus     Status: None   Collection Time: 01/07/2020  6:00 PM  Result Value Ref Range   Phosphorus 3.2 2.5 - 4.6 mg/dL    Comment: Performed at [facility], [place] [address] [place], [place] [address]  Culture, blood (routine x 2)     Status: None (Preliminary result)   Collection Time: 01/05/2020  6:08 PM   Specimen: BLOOD RIGHT HAND  Result Value Ref  Range   Specimen Description BLOOD RIGHT HAND    Special Requests      BOTTLES DRAWN AEROBIC AND ANAEROBIC Blood Culture adequate volume   Culture      NO GROWTH 2 DAYS Performed at [facility], [place] [address] [place], [place] [address]    Report Status PENDING   Culture, blood (routine x 2)     Status: None (Preliminary result)   Collection Time: 12/23/2019  6:20 PM   Specimen: BLOOD LEFT HAND  Result Value Ref Range   Specimen Description BLOOD LEFT HAND    Special Requests      BOTTLES DRAWN AEROBIC ONLY Blood Culture results may not be optimal due to an inadequate volume of blood received in culture bottles   Culture      NO GROWTH 2 DAYS Performed at [facility], [place] [address] [place], [place] [address]    Report Status PENDING   I-STAT 7, (LYTES, BLD GAS, ICA, H+H)     Status: Abnormal   Collection Time: 12/24/2019  6:31 PM  Result Value Ref Range   pH, Arterial 7.474 (H) 7.35 - 7.45   pCO2 arterial 27.4 (L) 32 - 48 mmHg   pO2, Arterial 225 (H) 83 - 108 mmHg   Bicarbonate 20.1 20.0 - 28.0 mmol/L   TCO2 21 (L) 22 - 32 mmol/L   O2 Saturation 100.0 %   Acid-base deficit 2.0 0.0 - 2.0 mmol/L   Sodium 143 135 - 145 mmol/L   Potassium 3.3 (L) 3.5 - 5.1 mmol/L   Calcium, Ion 1.16 1.15 - 1.40 mmol/L   HCT 37.0 (L) 39 - 52 %   Hemoglobin 12.6 (L) 13.0 - 17.0 g/dL   Patient temperature 98.5 F    Collection site Radial    Drawn by RT    Sample type ARTERIAL   Urinalysis,  Complete w Microscopic     Status: Abnormal   Collection Time: 01/05/2020  6:47 PM  Result Value Ref Range   Color, Urine AMBER (A) YELLOW    Comment: BIOCHEMICALS MAY BE AFFECTED BY COLOR   APPearance CLOUDY (A) CLEAR   Specific Gravity, Urine 1.025 1.005 - 1.030   pH 5.0 5.0 - 8.0   Glucose, UA >=500 (A) NEGATIVE mg/dL   Hgb urine dipstick MODERATE (A) NEGATIVE   Bilirubin Urine SMALL (A) NEGATIVE   Ketones, ur NEGATIVE NEGATIVE mg/dL   Protein, ur 100 (A) NEGATIVE mg/dL   Nitrite NEGATIVE  NEGATIVE   Leukocytes,Ua NEGATIVE NEGATIVE   RBC / HPF 6-10 0 - 5 RBC/hpf   WBC, UA 0-5 0 - 5 WBC/hpf   Bacteria, UA RARE (A) NONE SEEN   Squamous Epithelial / LPF 0-5 0 - 5   Mucus PRESENT    Amorphous Crystal PRESENT     Comment: Performed at [facility], [address], [place], [place] [address]  Lactic acid, plasma     Status: Abnormal   Collection Time: 01/04/2020  7:11 PM  Result Value Ref Range   Lactic Acid, Venous 2.4 (HH) 0.5 - 1.9 mmol/L    Comment: CRITICAL VALUE NOTED.  VALUE IS CONSISTENT WITH PREVIOUSLY REPORTED AND CALLED VALUE. Performed at [facility], [place] [address] [place], [place] [address]   Glucose, capillary     Status: Abnormal   Collection Time: 01/05/2020  8:06 PM  Result Value Ref Range   Glucose-Capillary 248 (H) 70 - 99 mg/dL    Comment: Glucose reference range applies only to samples taken after fasting for at least 8 hours.  Glucose, capillary     Status: Abnormal   Collection Time: 01/03/20 12:12 AM  Result Value Ref Range   Glucose-Capillary 202 (H) 70 - 99 mg/dL    Comment: Glucose reference range applies only to samples taken after fasting for at least 8 hours.  I-STAT 7, (LYTES, BLD GAS, ICA, H+H)     Status: Abnormal   Collection Time: 01/03/20  3:56 AM  Result Value Ref Range   pH, Arterial 7.540 (H) 7.35 - 7.45   pCO2 arterial 23.2 (L) 32 - 48 mmHg   pO2, Arterial 176 (H) 83 - 108 mmHg   Bicarbonate 19.8 (L) 20.0 - 28.0 mmol/L   TCO2 21 (L) 22 - 32 mmol/L   O2 Saturation 100.0 %   Acid-base deficit 1.0 0.0 - 2.0 mmol/L   Sodium 143 135 - 145 mmol/L   Potassium 3.2 (L) 3.5 - 5.1 mmol/L   Calcium, Ion 1.14 (L) 1.15 - 1.40 mmol/L   HCT 35.0 (L) 39 - 52 %   Hemoglobin 11.9 (L) 13.0 - 17.0 g/dL   Patient temperature 99.2 F    Collection site Radial    Drawn by Operator    Sample type ARTERIAL   CBC     Status: Abnormal   Collection Time: 01/03/20  5:08 AM  Result Value Ref Range   WBC 23.6 (H) 4.0 - 10.5 K/uL   RBC 3.62  (L) 4.22 - 5.81 MIL/uL   Hemoglobin 11.8 (L) 13.0 - 17.0 g/dL   HCT 34.9 (L) 39 - 52 %   MCV 96.4 80.0 - 100.0 fL   MCH 32.6 26.0 - 34.0 pg   MCHC 33.8 30.0 - 36.0 g/dL   RDW 13.8 11.5 - 15.5 %   Platelets 95 (L) 150 - 400 K/uL  Comment: Immature Platelet Fraction may be clinically indicated, consider ordering this additional test [id number] PLATELET COUNT CONFIRMED BY SMEAR REPEATED TO VERIFY    nRBC 0.2 0.0 - 0.2 %    Comment: Performed at [facility], [place] [address], [place], [place] [address]  Basic metabolic panel     Status: Abnormal   Collection Time: 01/03/20  5:08 AM  Result Value Ref Range   Sodium 141 135 - 145 mmol/L   Potassium 3.2 (L) 3.5 - 5.1 mmol/L   Chloride 111 98 - 111 mmol/L   CO2 18 (L) 22 - 32 mmol/L   Glucose, Bld 238 (H) 70 - 99 mg/dL    Comment: Glucose reference range applies only to samples taken after fasting for at least 8 hours.   BUN 40 (H) 8 - 23 mg/dL   Creatinine, Ser 1.12 0.61 - 1.24 mg/dL   Calcium 8.0 (L) 8.9 - 10.3 mg/dL   GFR calc non Af Amer >60 >60 mL/min   GFR calc Af Amer >60 >60 mL/min   Anion gap 12 5 - 15    Comment: Performed at [facility] [address] [place], [place] [address]  Magnesium     Status: None   Collection Time: 01/03/20  5:08 AM  Result Value Ref Range   Magnesium 1.7 1.7 - 2.4 mg/dL    Comment: Performed at [facility] [address], [place], [place] [address]  Phosphorus     Status: None   Collection Time: 01/03/20  5:08 AM  Result Value Ref Range   Phosphorus 2.7 2.5 - 4.6 mg/dL    Comment: Performed at [facility] [address] [place], [place] [address]  Triglycerides     Status: Abnormal   Collection Time: 01/03/20  5:08 AM  Result Value Ref Range   Triglycerides 313 (H) <150 mg/dL    Comment: Performed at [facility] [address] [place], [place] [address]  Glucose, capillary     Status: Abnormal   Collection Time: 01/03/20  7:58 AM  Result Value Ref  Range   Glucose-Capillary 228 (H) 70 - 99 mg/dL    Comment: Glucose reference range applies only to samples taken after fasting for at least 8 hours.  Glucose, capillary     Status: Abnormal   Collection Time: 01/03/20 11:47 AM  Result Value Ref Range   Glucose-Capillary 214 (H) 70 - 99 mg/dL    Comment: Glucose reference range applies only to samples taken after fasting for at least 8 hours.  Magnesium     Status: Abnormal   Collection Time: 01/03/20  1:24 PM  Result Value Ref Range   Magnesium 1.6 (L) 1.7 - 2.4 mg/dL    Comment: Performed at [facility] [address] [place], [place] [address]  Phosphorus     Status: None   Collection Time: 01/03/20  1:24 PM  Result Value Ref Range   Phosphorus 2.9 2.5 - 4.6 mg/dL    Comment: Performed at [facility] [address] [place], [place] [address]  Comprehensive metabolic panel     Status: Abnormal   Collection Time: 01/03/20  1:24 PM  Result Value Ref Range   Sodium 140 135 - 145 mmol/L   Potassium 2.9 (L) 3.5 - 5.1 mmol/L   Chloride 112 (H) 98 - 111 mmol/L   CO2 19 (L) 22 - 32 mmol/L   Glucose, Bld 253 (H) 70 -  99 mg/dL    Comment: Glucose reference range applies only to samples taken after fasting for at least 8 hours.   BUN 39 (H) 8 - 23 mg/dL   Creatinine, Ser 1.09 0.61 - 1.24 mg/dL   Calcium 7.7 (L) 8.9 - 10.3 mg/dL   Total Protein 5.2 (L) 6.5 - 8.1 g/dL   Albumin 1.6 (L) 3.5 - 5.0 g/dL   AST 53 (H) 15 - 41 U/L   ALT 50 (H) 0 - 44 U/L   Alkaline Phosphatase 117 38 - 126 U/L   Total Bilirubin 3.6 (H) 0.3 - 1.2 mg/dL   GFR calc non Af Amer >60 >60 mL/min   GFR calc Af Amer >60 >60 mL/min   Anion gap 9 5 - 15    Comment: Performed at [facility] [address] [place], [place] [address]  CSF culture     Status: None (Preliminary result)   Collection Time: 01/03/20  4:26 PM   Specimen: PATH Cytology CSF; Cerebrospinal Fluid  Result Value Ref Range   Specimen Description CSF    Special Requests  NONE    Gram Stain      RARE WBC PRESENT, PREDOMINANTLY PMN RARE GRAM POSITIVE COCCI CRITICAL RESULT CALLED TO, READ BACK BY AND VERIFIED WITH: RN Felicity Pellegrini [id number] Performed at [facility], [place] [address] [place], [place] [address]    Culture FEW STAPHYLOCOCCUS AUREUS    Report Status PENDING   Glucose, capillary     Status: Abnormal   Collection Time: 01/03/20  5:09 PM  Result Value Ref Range   Glucose-Capillary 216 (H) 70 - 99 mg/dL    Comment: Glucose reference range applies only to samples taken after fasting for at least 8 hours.  Vancomycin, trough     Status: Abnormal   Collection Time: 01/03/20  6:04 PM  Result Value Ref Range   Vancomycin Tr 26 (HH) 15 - 20 ug/mL    Comment: CRITICAL RESULT CALLED TO, READ BACK BY AND VERIFIED WITH: Nettie Elm RN AT 1907 01/03/20 BY Karie Chimera Performed at [facility], [place] [address], [place], [place] [address]   Magnesium     Status: Abnormal   Collection Time: 01/03/20  6:04 PM  Result Value Ref Range   Magnesium 1.2 (L) 1.7 - 2.4 mg/dL    Comment: Performed at [facility] [address] [place], [place] [address]  Phosphorus     Status: None   Collection Time: 01/03/20  6:04 PM  Result Value Ref Range   Phosphorus 3.0 2.5 - 4.6 mg/dL    Comment: Performed at [facility] [address], [place], [place] [address]  Glucose, capillary     Status: Abnormal   Collection Time: 01/03/20  8:34 PM  Result Value Ref Range   Glucose-Capillary 287 (H) 70 - 99 mg/dL    Comment: Glucose reference range applies only to samples taken after fasting for at least 8 hours.   Comment 1 Notify RN   Culture, respiratory (tracheal aspirate)     Status: None (Preliminary result)   Collection Time: 01/03/20  8:52 PM   Specimen: Tracheal Aspirate; Respiratory  Result Value Ref Range   Specimen Description TRACHEAL ASPIRATE    Special Requests NONE    Gram Stain      RARE WBC PRESENT, PREDOMINANTLY PMN NO  ORGANISMS SEEN    Culture      NO GROWTH < 24 HOURS Performed  at [facility], [place] [address], [place], [place] [address]    Report Status PENDING   Glucose, capillary     Status: Abnormal   Collection Time: 01/04/20 12:28 AM  Result Value Ref Range   Glucose-Capillary 395 (H) 70 - 99 mg/dL    Comment: Glucose reference range applies only to samples taken after fasting for at least 8 hours.   Comment 1 Notify RN   Glucose, capillary     Status: Abnormal   Collection Time: 01/04/20  4:16 AM  Result Value Ref Range   Glucose-Capillary 274 (H) 70 - 99 mg/dL    Comment: Glucose reference range applies only to samples taken after fasting for at least 8 hours.   Comment 1 Notify RN   Triglycerides     Status: Abnormal   Collection Time: 01/04/20  7:34 AM  Result Value Ref Range   Triglycerides 244 (H) <150 mg/dL    Comment: Performed at [facility] [address], [place], [place] [id number]  Magnesium     Status: None   Collection Time: 01/04/20  7:34 AM  Result Value Ref Range   Magnesium 1.8 1.7 - 2.4 mg/dL    Comment: Performed at [facility] [address] [place], [place] [address]  Phosphorus     Status: None   Collection Time: 01/04/20  7:34 AM  Result Value Ref Range   Phosphorus 2.7 2.5 - 4.6 mg/dL    Comment: Performed at [facility] [address] [place], [place] [address]  CBC with Differential/Platelet     Status: Abnormal   Collection Time: 01/04/20  7:34 AM  Result Value Ref Range   WBC 13.6 (H) 4.0 - 10.5 K/uL   RBC 2.97 (L) 4.22 - 5.81 MIL/uL   Hemoglobin 9.8 (L) 13.0 - 17.0 g/dL   HCT 29.4 (L) 39 - 52 %   MCV 99.0 80.0 - 100.0 fL   MCH 33.0 26.0 - 34.0 pg   MCHC 33.3 30.0 - 36.0 g/dL   RDW 14.5 11.5 - 15.5 %   Platelets 79 (L) 150 - 400 K/uL    Comment: CONSISTENT WITH PREVIOUS RESULT Immature Platelet Fraction may be clinically indicated, consider ordering this additional test [id number] REPEATED TO VERIFY    nRBC 0.0  0.0 - 0.2 %   Neutrophils Relative % 83 %   Neutro Abs 11.3 (H) 1.7 - 7.7 K/uL   Lymphocytes Relative 9 %   Lymphs Abs 1.2 0.7 - 4.0 K/uL   Monocytes Relative 4 %   Monocytes Absolute 0.6 0 - 1 K/uL   Eosinophils Relative 1 %   Eosinophils Absolute 0.1 0 - 0 K/uL   Basophils Relative 0 %   Basophils Absolute 0.0 0 - 0 K/uL   Immature Granulocytes 3 %   Abs Immature Granulocytes 0.34 (H) 0.00 - 0.07 K/uL    Comment: Performed at [facility] [address] [place], [place] [address]  Comprehensive metabolic panel     Status: Abnormal   Collection Time: 01/04/20  7:34 AM  Result Value Ref Range   Sodium 140 135 - 145 mmol/L   Potassium 3.3 (L) 3.5 - 5.1 mmol/L   Chloride 113 (H) 98 - 111 mmol/L   CO2 19 (L) 22 - 32 mmol/L   Glucose, Bld 316 (H) 70 - 99 mg/dL    Comment: Glucose reference range applies only to samples taken after fasting for at least 8  hours.   BUN 37 (H) 8 - 23 mg/dL   Creatinine, Ser 0.93 0.61 - 1.24 mg/dL   Calcium 7.5 (L) 8.9 - 10.3 mg/dL   Total Protein 5.1 (L) 6.5 - 8.1 g/dL   Albumin 1.4 (L) 3.5 - 5.0 g/dL   AST 62 (H) 15 - 41 U/L   ALT 51 (H) 0 - 44 U/L   Alkaline Phosphatase 139 (H) 38 - 126 U/L   Total Bilirubin 2.9 (H) 0.3 - 1.2 mg/dL   GFR calc non Af Amer >60 >60 mL/min   GFR calc Af Amer >60 >60 mL/min   Anion gap 8 5 - 15    Comment: Performed at [facility] [address] [place], [place] [address]      Component Value Date/Time   SDES TRACHEAL ASPIRATE 01/03/2020 2052   SPECREQUEST NONE 01/03/2020 2052   CULT  01/03/2020 2052    NO GROWTH < 24 HOURS Performed at [facility] [address] [place], [place] [address]    REPTSTATUS PENDING 01/03/2020 2052   MR BRAIN WO CONTRAST  Result Date: 01/14/2020 CLINICAL DATA:  Acute encephalopathy. MRSA bacteremia. Possible stroke. EXAM: MRI HEAD WITHOUT CONTRAST TECHNIQUE: Multiplanar, multiecho pulse sequences of the brain and surrounding structures were obtained without  intravenous contrast. COMPARISON:  Head CT 12/30/2019 FINDINGS: Brain: There are punctate foci of trace diffusion weighted signal hyperintensity along the posterior aspects of the atria/occipital horns of both lateral ventricles with evidence of trace dependently layering material in the left lateral ventricle on FLAIR imaging (series 16, image 13). There is no associated susceptibility artifact to indicate hemorrhage. There is no periventricular edema. A few punctate foci of T2 hyperintensity elsewhere in the cerebral white matter bilaterally are nonspecific but compatible with minimal chronic small vessel ischemic disease with a chronic lacunar infarct noted in the right corona radiata. No sizable acute infarct, mass, midline shift, or extra-axial fluid collection is identified. Mild cerebral atrophy is within normal limits for age. Vascular: Major intracranial vascular flow voids are preserved. Skull and upper cervical spine: Unremarkable bone marrow signal. Sinuses/Orbits: Unremarkable orbits. Mild left greater than right ethmoid and left maxillary sinus mucosal thickening. No significant mastoid fluid. Other: None. IMPRESSION: 1. Minimal signal abnormality/debris in the dependent portions of both lateral ventricles most suggestive of infection with this history. No periventricular edema or hydrocephalus. Consider lumbar puncture for further evaluation. 2. No other evidence of acute intracranial abnormality. 3. Minimal chronic small vessel ischemic disease. Electronically Signed   By: Logan Bores M.D.   On: 12/28/2019 17:26   DG CHEST PORT 1 VIEW  Result Date: 01/01/2020 CLINICAL DATA:  Intubation. EXAM: PORTABLE CHEST 1 VIEW COMPARISON:  12/28/2019 FINDINGS: An endotracheal tube has been placed and terminates approximately 3.5 cm above the carina. The cardiomediastinal silhouette is unchanged with normal heart size. The lungs are mildly hypoinflated without evidence of pulmonary edema, confluent airspace  opacity, or pneumothorax. Trace pleural effusions are questioned. IMPRESSION: 1. Endotracheal tube as above. 2. Possible trace pleural effusions. Electronically Signed   By: Logan Bores M.D.   On: 01/01/2020 18:21   DG Abd Portable 1V  Result Date: 01/03/2020 CLINICAL DATA:  Evaluate enteric tube. EXAM: PORTABLE ABDOMEN - 1 VIEW COMPARISON:  None. FINDINGS: Evidence of patient's nasogastric tube with tip over the midline in the upper abdomen likely over the distal body of the stomach. Visualized bowel gas pattern is nonobstructive. There are degenerative changes of the spine. IMPRESSION: Nasogastric tube  with tip over the midline upper abdomen likely over the distal body of the stomach. Electronically Signed   By: Marin Olp M.D.   On: 01/03/2020 12:29   EEG adult  Result Date: 01/03/2020 Lora Havens, MD     01/03/2020  5:25 PM Patient Name: Deandrae Wajda MRN: [id number] Epilepsy Attending: Lora Havens Referring Physician/Provider: Dr Amie [place] Date: 01/07/2020 Duration: 23.32 mins Patient history: 69yo M with ams and left sided weakness. EEG to evaluate for seizure. Level of alertness: comatose AEDs during EEG study: Propofol Technical aspects: This EEG study was done with scalp electrodes positioned according to the 10-20 International system of electrode placement. Electrical activity was acquired at a sampling rate of 500Hz and reviewed with a high frequency filter of 70Hz and a low frequency filter of 1Hz. EEG data were recorded continuously and digitally stored. Description: EEG showed continuous generalized 6-8hz theta-alpha activity as well as intermittent generalized 15-18Hz beta activity, maximal frontocentral region. Brief periods of background attenuation lasting 2-3 seconds were also noted. Sharp transients were seen in left temporal region. Hyperventilation and photic stimulation were not performed.   ABNORMALITY -Continuous slow, generalized IMPRESSION: This study is suggestive  of severe diffuse encephalopathy, nonspecific etiology but likely related to sedation. No seizures or definite epileptiform discharges were seen throughout the recording. Lora Havens   ECHOCARDIOGRAM COMPLETE  Result Date: 01/03/2020    ECHOCARDIOGRAM REPORT   Patient Name:   Zykee NERI SAMEK Date of Exam: 01/03/2020 Medical Rec #:  [id number]      Height:       70.0 in Accession #:    [id number]     Weight:       313.1 lb Date of Birth:  [birth year]       BSA:          2.525 m Patient Age:    76 years       BP:           120/74 mmHg Patient Gender: M              HR:           116 bpm. Exam Location:  Inpatient Procedure: 2D Echo and Intracardiac Opacification Agent Indications:    acute systolic chf 299.37  History:        Patient has no prior history of Echocardiogram examinations.                 Signs/Symptoms:Bacteremia; Risk Factors:Diabetes.  Sonographer:    Johny Chess Referring Phys: [id number] WHITNEY F DAVIS  Sonographer Comments: Patient is morbidly obese and echo performed with patient supine and on artificial respirator. Image acquisition challenging due to patient body habitus. IMPRESSIONS  1. Left ventricular ejection fraction, by estimation, is 60 to 65%. The left ventricle has normal function. The left ventricle has no regional wall motion abnormalities. There is mild concentric left ventricular hypertrophy. Left ventricular diastolic function could not be evaluated.  2. Right ventricular systolic function is normal. The right ventricular size is normal.  3. The mitral valve is normal in structure. Mild mitral valve regurgitation. No evidence of mitral stenosis.  4. The aortic valve is normal in structure. Aortic valve regurgitation is not visualized. No aortic stenosis is present.  5. The inferior vena cava is normal in size with greater than 50% respiratory variability, suggesting right atrial pressure of 3 mmHg. FINDINGS  Left Ventricle: Left ventricular ejection fraction, by estimation, is  60 to 65%. The  left ventricle has normal function. The left ventricle has no regional wall motion abnormalities. Definity contrast agent was given IV to delineate the left ventricular  endocardial borders. The left ventricular internal cavity size was normal in size. There is mild concentric left ventricular hypertrophy. Left ventricular diastolic function could not be evaluated due to atrial fibrillation. Left ventricular diastolic function could not be evaluated. Right Ventricle: The right ventricular size is normal. No increase in right ventricular wall thickness. Right ventricular systolic function is normal. Left Atrium: Left atrial size was normal in size. Right Atrium: Right atrial size was normal in size. Pericardium: There is no evidence of pericardial effusion. Mitral Valve: The mitral valve is normal in structure. Normal mobility of the mitral valve leaflets. Mild mitral valve regurgitation. No evidence of mitral valve stenosis. Tricuspid Valve: The tricuspid valve is normal in structure. Tricuspid valve regurgitation is trivial. No evidence of tricuspid stenosis. Aortic Valve: The aortic valve is normal in structure. Aortic valve regurgitation is not visualized. No aortic stenosis is present. Pulmonic Valve: The pulmonic valve was normal in structure. Pulmonic valve regurgitation is not visualized. No evidence of pulmonic stenosis. Aorta: The aortic root is normal in size and structure. Venous: The inferior vena cava is normal in size with greater than 50% respiratory variability, suggesting right atrial pressure of 3 mmHg. IAS/Shunts: No atrial level shunt detected by color flow Doppler.  LEFT VENTRICLE PLAX 2D LVIDd:         4.60 cm LVIDs:         3.00 cm LV PW:         1.30 cm LV IVS:        1.00 cm LVOT diam:     2.10 cm LVOT Area:     3.46 cm  IVC IVC diam: 1.90 cm LEFT ATRIUM           Index       RIGHT ATRIUM          Index LA diam:      3.20 cm 1.27 cm/m  RA Area:     9.82 cm LA Vol (A2C):  41.9 ml 16.59 ml/m RA Volume:   19.50 ml 7.72 ml/m LA Vol (A4C): 43.0 ml 17.03 ml/m   AORTA Ao Root diam: 3.40 cm Ao Asc diam:  2.90 cm  SHUNTS Systemic Diam: 2.10 cm Ena Dawley MD Electronically signed by Ena Dawley MD Signature Date/Time: 01/03/2020/2:25:42 PM    Final    DG FLUORO GUIDE LUMBAR PUNCTURE  Result Date: 01/04/2020 CLINICAL DATA:  69 year old male with a history of acute in cephalopathy with concern for central nervous system infection referred for LP EXAM: DIAGNOSTIC LUMBAR PUNCTURE UNDER FLUOROSCOPIC GUIDANCE FLUOROSCOPY TIME:  Fluoroscopy Time:  3 minutes PROCEDURE: Informed consent was obtained prior to the procedure, including potential complications of headache, allergy, and pain. With the patient prone, the lower back was prepped with Betadine. 1% Lidocaine was used for local anesthesia. Dr. Jimmye Norman initiated the procedure, and I assisted with the procedure after the initial needle placement. Lumbar puncture was first performed at the L2-L3 level using a 20 gauge, 5 inch needle. Multiple repositioning of the needle yielded no CSF at this level. Lumbar puncture was then performed at the L3-L4 level using first a 3-1/2 inch, 20 gauge needle. Multiple repositioning performed at this level yielding no CSF. A 5 inch 20 gauge needle was also used at this level. Lumbar puncture was then performed at the L4-L5 level using 5 inch 20  gauge needle. Upon initial placement, there was spontaneous return of what appear to be some viscous yellow fluid to the hub of the needle, with no further significant volume. Strategies used included reversed Trendelenburg position. The patient is intubated and cannot provide a Valsalva maneuver. The patient cannot be position into a decubitus position given his body habitus. The volume of fluid that was acquired was approximately 0.2 cc which was sent for a culture. Bandages were placed at the completion. IMPRESSION: Attempt at fluoroscopic LP, using an  approach at L2-L3, L3-L4, L4-L5, with very scant fluid returned at the lowest level. A small sample was sent for culture. PLAN: If another attempt is required, would suggest aggressive hydration of the patient prior to performing. Electronically Signed   By: Corrie Mckusick D.O.   On: 01/04/2020 08:12   Recent Results (from the past 240 hour(s))  Urine culture     Status: None   Collection Time: 12/25/2019  4:10 PM   Specimen: Urine, Random  Result Value Ref Range Status   Specimen Description URINE, RANDOM  Final   Special Requests NONE  Final   Culture   Final    NO GROWTH Performed at [facility], [address], [place], [place] [address]    Report Status 01/04/2020 FINAL  Final  Culture, blood (routine x 2)     Status: None (Preliminary result)   Collection Time: 12/18/2019  6:08 PM   Specimen: BLOOD RIGHT HAND  Result Value Ref Range Status   Specimen Description BLOOD RIGHT HAND  Final   Special Requests   Final    BOTTLES DRAWN AEROBIC AND ANAEROBIC Blood Culture adequate volume   Culture   Final    NO GROWTH 2 DAYS Performed at [facility], [place] [address] [place], [place] [address]    Report Status PENDING  Incomplete  Culture, blood (routine x 2)     Status: None (Preliminary result)   Collection Time: 01/05/2020  6:20 PM   Specimen: BLOOD LEFT HAND  Result Value Ref Range Status   Specimen Description BLOOD LEFT HAND  Final   Special Requests   Final    BOTTLES DRAWN AEROBIC ONLY Blood Culture results may not be optimal due to an inadequate volume of blood received in culture bottles   Culture   Final    NO GROWTH 2 DAYS Performed at [facility], [place] [address] [place], [place] [address]    Report Status PENDING  Incomplete  CSF culture     Status: None (Preliminary result)   Collection Time: 01/03/20  4:26 PM   Specimen: PATH Cytology CSF; Cerebrospinal Fluid  Result Value Ref Range Status   Specimen Description CSF  Final   Special Requests NONE   Final   Gram Stain   Final    RARE WBC PRESENT, PREDOMINANTLY PMN RARE GRAM POSITIVE COCCI CRITICAL RESULT CALLED TO, READ BACK BY AND VERIFIED WITH: RN Felicity Pellegrini [id number] MLM Performed at [facility], [address], [place], [place] [address]    Culture FEW STAPHYLOCOCCUS AUREUS  Final   Report Status PENDING  Incomplete  Culture, respiratory (tracheal aspirate)     Status: None (Preliminary result)   Collection Time: 01/03/20  8:52 PM   Specimen: Tracheal Aspirate; Respiratory  Result Value Ref Range Status   Specimen Description TRACHEAL ASPIRATE  Final   Special Requests NONE  Final   Gram Stain   Final  RARE WBC PRESENT, PREDOMINANTLY PMN NO ORGANISMS SEEN    Culture   Final    NO GROWTH < 24 HOURS Performed at [facility] [address], [place], [place] [address]    Report Status PENDING  Incomplete    Microbiology: Recent Results (from the past 240 hour(s))  Urine culture     Status: None   Collection Time: 01/10/2020  4:10 PM   Specimen: Urine, Random  Result Value Ref Range Status   Specimen Description URINE, RANDOM  Final   Special Requests NONE  Final   Culture   Final    NO GROWTH Performed at [facility], [address], [place], [place] [address]    Report Status 01/04/2020 FINAL  Final  Culture, blood (routine x 2)     Status: None (Preliminary result)   Collection Time: 12/20/2019  6:08 PM   Specimen: BLOOD RIGHT HAND  Result Value Ref Range Status   Specimen Description BLOOD RIGHT HAND  Final   Special Requests   Final    BOTTLES DRAWN AEROBIC AND ANAEROBIC Blood Culture adequate volume   Culture   Final    NO GROWTH 2 DAYS Performed at [facility], [place] [address], [place], [place] [address]    Report Status PENDING  Incomplete  Culture, blood (routine x 2)     Status: None (Preliminary result)   Collection Time: 12/27/2019  6:20 PM   Specimen: BLOOD LEFT HAND  Result Value Ref Range Status   Specimen Description  BLOOD LEFT HAND  Final   Special Requests   Final    BOTTLES DRAWN AEROBIC ONLY Blood Culture results may not be optimal due to an inadequate volume of blood received in culture bottles   Culture   Final    NO GROWTH 2 DAYS Performed at [facility], [place] [address], [place], [place] [address]    Report Status PENDING  Incomplete  CSF culture     Status: None (Preliminary result)   Collection Time: 01/03/20  4:26 PM   Specimen: PATH Cytology CSF; Cerebrospinal Fluid  Result Value Ref Range Status   Specimen Description CSF  Final   Special Requests NONE  Final   Gram Stain   Final    RARE WBC PRESENT, PREDOMINANTLY PMN RARE GRAM POSITIVE COCCI CRITICAL RESULT CALLED TO, READ BACK BY AND VERIFIED WITH: RN Felicity Pellegrini [phone] MLM Performed at [facility], [address] [place], [place] [address]    Culture FEW STAPHYLOCOCCUS AUREUS  Final   Report Status PENDING  Incomplete  Culture, respiratory (tracheal aspirate)     Status: None (Preliminary result)   Collection Time: 01/03/20  8:52 PM   Specimen: Tracheal Aspirate; Respiratory  Result Value Ref Range Status   Specimen Description TRACHEAL ASPIRATE  Final   Special Requests NONE  Final   Gram Stain   Final    RARE WBC PRESENT, PREDOMINANTLY PMN NO ORGANISMS SEEN    Culture   Final    NO GROWTH < 24 HOURS Performed at [facility], [place] [address], [place],  [address]    Report Status PENDING  Incomplete    Radiographs and labs were personally reviewed by me.   Bobby Rumpf, MD [facility] [phone] 01/04/2020, 10:31 AM

## 2020-01-04 NOTE — Progress Notes (Signed)
NAME:  Isaac Hamilton, MRN:  [id number], DOB:  12-22-1950, LOS: 2 ADMISSION DATE:  01-24-2020, CONSULTATION DATE:  2020/01/24 REFERRING MD:  Ophelia Charter - TRH, CHIEF COMPLAINT:  Low back pain, fatigue , AMS + MRSA Bacteremia  Brief History   69 yo M transferred to [facility] to [facility] service 6/18. Upon arrival, unresponsive. PCCM consulted for evaluation. Neurology consulted as code stroke   History of present illness   69 yo M PMH DM, Afib, hx MRSA who transferred to [facility] from [place] with reported MRSA septicemia and inability to obtain MRI due to imaging; admitted to [facility] service, reportedly AOOx4 prior to transfer. Admitted to [facility] for possible back strain in setting of helping sig other move a scooter. Found to have MRSA bacteremia, and worsening malaise, fatigue. Hospitalization at [facility] complicated by AKI, thrombocytopenia, transaminitis, toxic encephalopathy. TEE not pursued due to thrombocytopenia.  Arrives to [facility] on dilt gtt, and unresponsive with L sided weakness. Concern that patient is not protecting airway; PCCM consulted and neurology paged for "code stroke."   PCCM and neurology at bedside with rapid response and RRT.    Past Medical History  DM2 Afib Chronic back pain HLD OA HTN Morbid obesity   Significant Hospital Events   6/18 arrives to [facility], unresponsive.   Consults:  Neurology ID PCCM  Procedures:  6/18 ETT>   Significant Diagnostic Tests:  6/18 CXR >>  6/19 CSF consistent with ventriculities 6/20 MRI spine -  Micro Data:  Known MRSA bacteremia  BC at [facility] 6/18-no growth at 24 hours.  Antimicrobials:  Vanc 6/18 >>  Interim history/subjective:  Tolerated weaning this morning and HR better controlled.  Objective   Blood pressure 130/75, pulse (!) 105, temperature 100.2 F (37.9 C), resp. rate (!) 26, height 5\' 10"  (1.778 m), weight (!) 151 kg, SpO2 100 %.    Vent Mode: PRVC FiO2 (%):  [40 %] 40 % Set Rate:  [15 bmp] 15 bmp Vt Set:  [580 mL] 580  mL PEEP:  [5 cmH20] 5 cmH20 Pressure Support:  [10 cmH20] 10 cmH20 Plateau Pressure:  [18 cmH20] 18 cmH20   Intake/Output Summary (Last 24 hours) at 01/04/2020 1545 Last data filed at 01/04/2020 1400 Gross per 24 hour  Intake 3382.31 ml  Output 2325 ml  Net 1057.31 ml   Filed Weights   2020/01/24 1717 01/03/20 0500 01/04/20 0326  Weight: (!) 142 kg (!) 142 kg (!) 151 kg    Examination: General: Morbidly obese chronically and critically ill 69-year-old M, intubated NAD HENT: NCAT. Redundant neck tissue. ETT secure.  Minimal OG tube output Lungs: No ventilator dyssynchrony.  Chest is clear to auscultation bilaterally. Cardiovascular: irir.  Heart sounds are distant.  Capillary refill normal. Abdomen: Obese, soft, ndnt  Extremities: Edematous. No obvious joint deformity. No clubbing  Neuro: Flicker to pain L, R purposeful movement. Moans to tactile stimuli. PERRL GU: foley     Resolved Hospital Problem list     Assessment & Plan:   Critically ill due to acute respiratory failure in setting of encephalopathy and poor airway protection, requiring intubation, requiring mechanical ventilation.  Nominal oxygen requirements. - Daily SBT   Acute Encephalopathy likely toxic metabolic encephalopathy in setting of severe sepsis due to MRSA bacteremia and meningitis. - MRI to rule out epidural source.   MRSA Bacteremia with severe sepsis.  Currently not on vasopressors.  Patient scratches his skin and nose frequently.  Likely source of infection. -Continue vancomycin per pharmacy dosing.  Maintain trough 15-20 -TEE to evaluate for endocarditis.  Limited acoustic windows via transthoracic route.   Critically ill due to atrial fibrillation with rapid ventricular response, requiring amiodarone and diltiazem infusions for rate control. Improving rate control. - Convert to oral agents once soon.  -Consider addition of beta-blocker if fails to respond to volume challenge. -Maintain potassium  greater than 4 mmol/L.  Best practice:  Diet: Initiate tube feeding Pain/Anxiety/Delirium protocol (if indicated): Continue propofol and fentanyl.  Daily wake up assessment. VAP protocol (if indicated): Bundle in place. DVT prophylaxis: SCD-start subcutaneous heparin.  If imaging favorable will consider starting IV heparin for atrial fibrillation. GI prophylaxis: protonix  Glucose control: SSI  Mobility: BR Code Status: Full  Family Communication: I updated the sons today at the bedside. Disposition: to ICU   Labs   CBC: Recent Labs  Lab 12/21/2019 1800 12/28/2019 1831 01/03/20 0356 01/03/20 0508 01/04/20 0734  WBC 23.4*  --   --  23.6* 13.6*  NEUTROABS 19.5*  --   --   --  11.3*  HGB 12.3* 12.6* 11.9* 11.8* 9.8*  HCT 35.5* 37.0* 35.0* 34.9* 29.4*  MCV 96.5  --   --  96.4 99.0  PLT ACLMP  --   --  95* 79*    Basic Metabolic Panel: Recent Labs  Lab 01/10/2020 1800 12/30/2019 1800 01/01/2020 1831 01/03/20 0356 01/03/20 0508 01/03/20 1324 01/03/20 1804 01/04/20 0734  NA 141   < > 143 143 141 140  --  140  K 3.4*   < > 3.3* 3.2* 3.2* 2.9*  --  3.3*  CL 112*  --   --   --  111 112*  --  113*  CO2 20*  --   --   --  18* 19*  --  19*  GLUCOSE 263*  --   --   --  238* 253*  --  316*  BUN 31*  --   --   --  40* 39*  --  37*  CREATININE 0.87  --   --   --  1.12 1.09  --  0.93  CALCIUM 8.0*  --   --   --  8.0* 7.7*  --  7.5*  MG 1.7  --   --   --  1.7 1.6* 1.2* 1.8  PHOS 3.2  --   --   --  2.7 2.9 3.0 2.7   < > = values in this interval not displayed.   GFR: Estimated Creatinine Clearance: 112 mL/min (by C-G formula based on SCr of 0.93 mg/dL). Recent Labs  Lab 12/26/2019 1800 01/03/2020 1911 01/03/20 0508 01/04/20 0734  PROCALCITON 0.86  --   --   --   WBC 23.4*  --  23.6* 13.6*  LATICACIDVEN 2.4* 2.4*  --   --     Liver Function Tests: Recent Labs  Lab 12/20/2019 1800 01/03/20 1324 01/04/20 0734  AST 48* 53* 62*  ALT 53* 50* 51*  ALKPHOS 125 117 139*  BILITOT 5.1*  3.6* 2.9*  PROT 5.6* 5.2* 5.1*  ALBUMIN 1.8* 1.6* 1.4*   No results for input(s): LIPASE, AMYLASE in the last 168 hours. No results for input(s): AMMONIA in the last 168 hours.  ABG    Component Value Date/Time   PHART 7.540 (H) 01/03/2020 0356   PCO2ART 23.2 (L) 01/03/2020 0356   PO2ART 176 (H) 01/03/2020 0356   HCO3 19.8 (L) 01/03/2020 0356   TCO2 21 (L) 01/03/2020 0356   ACIDBASEDEF 1.0 01/03/2020  0356   O2SAT 100.0 01/03/2020 0356     Coagulation Profile: Recent Labs  Lab 01/29/20 1800  INR 1.3*    Cardiac Enzymes: No results for input(s): CKTOTAL, CKMB, CKMBINDEX, TROPONINI in the last 168 hours.  HbA1C: Hemoglobin A1C  Date/Time Value Ref Range Status  12/04/2019 11:02 AM 9.1 (A) 4.0 - 5.6 % Final  09/02/2019 11:06 AM 9.7 (A) 4.0 - 5.6 % Final   Hgb A1c MFr Bld  Date/Time Value Ref Range Status  2020-01-29 06:00 PM 8.5 (H) 4.8 - 5.6 % Final    Comment:    (NOTE) Pre diabetes:          5.7%-6.4%  Diabetes:              >6.4%  Glycemic control for   <7.0% adults with diabetes     CBG: Recent Labs  Lab 01/03/20 2034 01/04/20 0028 01/04/20 0416 01/04/20 0802 01/04/20 1152  GLUCAP 287* 395* 274* 295* 267*    CRITICAL CARE Performed by: Lynnell Catalan   Total critical care time: 40 minutes  Critical care time was exclusive of separately billable procedures and treating other patients.  Critical care was necessary to treat or prevent imminent or life-threatening deterioration.  Critical care was time spent personally by me on the following activities: development of treatment plan with patient and/or surrogate as well as nursing, discussions with consultants, evaluation of patient's response to treatment, examination of patient, obtaining history from patient or surrogate, ordering and performing treatments and interventions, ordering and review of laboratory studies, ordering and review of radiographic studies, pulse oximetry, re-evaluation of  patient's condition and participation in multidisciplinary rounds.  Lynnell Catalan, MD [facility] ICU Physician [facility] [place] [facility]  Pager: [phone] Mobile: [phone] After hours: [phone].   01/04/2020 3:45 PM

## 2020-01-04 NOTE — Progress Notes (Signed)
eLink Physician-Brief Progress Note Patient Name: Isaac Hamilton DOB: 12/20/1950 MRN: [id number]   Date of Service  01/04/2020  HPI/Events of Note  Notified of hyperglycemia 200s to 300s on moderate dose SSI. Also on amiodaron and cardizem drip both D5 based.  eICU Interventions  Start Levemir 10 q hs. If glucose remains elevated may need to start insulin drip     Intervention Category Major Interventions: Hyperglycemia - active titration of insulin therapy  Rosalie Gums Amaan Meyer 01/04/2020, 2:04 AM

## 2020-01-04 NOTE — Progress Notes (Signed)
Neurology Progress Note   S:// Seen and examined. Pending MRI of the T and L-spine.   O:// Current vital signs: BP (!) 127/53   Pulse (!) 104   Temp 100 F (37.8 C)   Resp (!) 23   Ht 5' 10"  (1.778 m)   Wt (!) 151 kg   SpO2 99%   BMI 47.77 kg/m  Vital signs in last 24 hours: Temp:  [98.7 F (37.1 C)-101.7 F (38.7 C)] 100 F (37.8 C) (06/20 1000) Pulse Rate:  [101-279] 104 (06/20 1000) Resp:  [23-34] 23 (06/20 1000) BP: (106-144)/(53-88) 127/53 (06/20 1000) SpO2:  [95 %-100 %] 99 % (06/20 1000) FiO2 (%):  [40 %] 40 % (06/20 0838) Weight:  [151 kg] 151 kg (06/20 0326) Neurological exam Sedated intubated On propofol which was held for examination. No spontaneous movements noted Pupils are equal round reactive light No gaze preference or deviation Facial symmetry difficult to ascertain due to the endotracheal tube. Has bilateral corneal reflexes present Breathe over the vent after sedation is held. No movement to noxious immolation but grimaces to noxious stimulation.   Medications  Current Facility-Administered Medications:  .  acetaminophen (TYLENOL) 160 MG/5ML solution 650 mg, 650 mg, Per Tube, Q4H PRN, Judd Lien, MD, 650 mg at 01/04/20 0609 .  amiodarone (NEXTERONE) 1.8 mg/mL load via infusion 150 mg, 150 mg, Intravenous, Once **FOLLOWED BY** [EXPIRED] amiodarone (NEXTERONE PREMIX) 360-4.14 MG/200ML-% (1.8 mg/mL) IV infusion, 60 mg/hr, Intravenous, Continuous, Last Rate: 33.3 mL/hr at 01/03/20 0100, 60 mg/hr at 01/03/20 0100 **FOLLOWED BY** amiodarone (NEXTERONE PREMIX) 360-4.14 MG/200ML-% (1.8 mg/mL) IV infusion, 30 mg/hr, Intravenous, Continuous, Bowser, Grace E, NP, Last Rate: 16.67 mL/hr at 01/04/20 1000, 30 mg/hr at 01/04/20 1000 .  chlorhexidine gluconate (MEDLINE KIT) (PERIDEX) 0.12 % solution 15 mL, 15 mL, Mouth Rinse, BID, Icard, Bradley L, DO, 15 mL at 01/04/20 0755 .  Chlorhexidine Gluconate Cloth 2 % PADS 6 each, 6 each, Topical, Daily, Agarwala,  Ravi, MD .  diltiazem (CARDIZEM) 125 mg in dextrose 5% 125 mL (1 mg/mL) infusion, 5-15 mg/hr, Intravenous, Continuous, Ogan, Okoronkwo U, MD, Last Rate: 15 mL/hr at 01/04/20 1000, 15 mg/hr at 01/04/20 1000 .  docusate (COLACE) 50 MG/5ML liquid 100 mg, 100 mg, Per Tube, BID, Icard, Bradley L, DO, 100 mg at 01/03/20 2106 .  docusate sodium (COLACE) capsule 100 mg, 100 mg, Oral, BID PRN, Merlene Laughter F, NP .  feeding supplement (PRO-STAT SUGAR FREE 64) liquid 30 mL, 30 mL, Per Tube, BID, Agarwala, Ravi, MD, 30 mL at 01/03/20 2106 .  feeding supplement (VITAL HIGH PROTEIN) liquid 1,000 mL, 1,000 mL, Per Tube, Q24H, Agarwala, Ravi, MD, 1,000 mL at 01/03/20 1727 .  fentaNYL (SUBLIMAZE) bolus via infusion 25 mcg, 25 mcg, Intravenous, Q15 min PRN, Merlene Laughter F, NP, 25 mcg at 01/03/20 1740 .  fentaNYL (SUBLIMAZE) injection 25 mcg, 25 mcg, Intravenous, Once, Rosana Hoes, Whitney F, NP .  fentaNYL 2524mg in NS 2560m(1071mml) infusion-PREMIX, 25-200 mcg/hr, Intravenous, Continuous, DavMerlene Laughter NP, Last Rate: 15 mL/hr at 01/04/20 1000, 150 mcg/hr at 01/04/20 1000 .  heparin injection 5,000 Units, 5,000 Units, Subcutaneous, Q8H, AgaKipp BroodD, 5,000 Units at 01/04/20 0516 .  insulin aspart (novoLOG) injection 0-15 Units, 0-15 Units, Subcutaneous, Q4H, DavMerlene Laughter NP, 8 Units at 01/04/20 [id number] insulin detemir (LEVEMIR) injection 10 Units, 10 Units, Subcutaneous, QHS, AveJudd LienD, 10 Units at 01/04/20 0225 .  MEDLINE mouth rinse, 15 mL, Mouth Rinse, 10 times per  day, Icard, Bradley L, DO, 15 mL at 01/04/20 0517 .  pantoprazole (PROTONIX) injection 40 mg, 40 mg, Intravenous, QHS, Merlene Laughter F, NP, 40 mg at 01/03/20 2101 .  polyethylene glycol (MIRALAX / GLYCOLAX) packet 17 g, 17 g, Oral, Daily PRN, Merlene Laughter F, NP .  polyethylene glycol (MIRALAX / GLYCOLAX) packet 17 g, 17 g, Per Tube, Daily, Icard, Bradley L, DO .  potassium chloride (KLOR-CON) packet 40 mEq, 40 mEq, Oral,  BID, Agarwala, Ravi, MD, 40 mEq at 01/03/20 2106 .  propofol (DIPRIVAN) 1000 MG/100ML infusion, 0-50 mcg/kg/min, Intravenous, Continuous, Merlene Laughter F, NP, Last Rate: 34.1 mL/hr at 01/04/20 1000, 40 mcg/kg/min at 01/04/20 1000 .  vancomycin (VANCOREADY) IVPB 1500 mg/300 mL, 1,500 mg, Intravenous, Q12H, Einar Grad, [facility], Last Rate: 150 mL/hr at 01/04/20 1000, Rate Verify at 01/04/20 1000 Labs CBC    Component Value Date/Time   WBC 13.6 (H) 01/04/2020 0734   RBC 2.97 (L) 01/04/2020 0734   HGB 9.8 (L) 01/04/2020 0734   HCT 29.4 (L) 01/04/2020 0734   PLT 79 (L) 01/04/2020 0734   MCV 99.0 01/04/2020 0734   MCH 33.0 01/04/2020 0734   MCHC 33.3 01/04/2020 0734   RDW 14.5 01/04/2020 0734   LYMPHSABS 1.2 01/04/2020 0734   MONOABS 0.6 01/04/2020 0734   EOSABS 0.1 01/04/2020 0734   BASOSABS 0.0 01/04/2020 0734    CMP     Component Value Date/Time   NA 140 01/04/2020 0734   K 3.3 (L) 01/04/2020 0734   CL 113 (H) 01/04/2020 0734   CO2 19 (L) 01/04/2020 0734   GLUCOSE 316 (H) 01/04/2020 0734   BUN 37 (H) 01/04/2020 0734   CREATININE 0.93 01/04/2020 0734   CALCIUM 7.5 (L) 01/04/2020 0734   PROT 5.1 (L) 01/04/2020 0734   ALBUMIN 1.4 (L) 01/04/2020 0734   AST 62 (H) 01/04/2020 0734   ALT 51 (H) 01/04/2020 0734   ALKPHOS 139 (H) 01/04/2020 0734   BILITOT 2.9 (H) 01/04/2020 0734   GFRNONAA >60 01/04/2020 0734   GFRAA >60 01/04/2020 0734    Imaging I have reviewed images in epic and the results pertinent to this consultation are: No new imaging to review.  Assessment: 69 year old man with multiple cardiovascular risk factors transferred from an outside hospital with multiple issues including MRSA bacteremia, new onset atrial fibrillation with RVR, deranged liver and kidney functions noted to be extremely lethargic and neurological consultation obtained as a code stroke for left-sided weakness. Family reports him being altered in his mentation for over 2 days-he was not a  candidate for any emergent stroke interventions. MRI brain did not reveal a stroke.  Showed debris in the ventricle suggestive of ventriculitis/meningitis. Routine EEG showed generalized slowing. Spinal tap was attempted under fluoroscopy guidance but was only able to get 0.5 cc of yellow fluid-likely secondary to the CNS infection. Initial presentation to the outside hospital was for back pain after which she became more altered and his condition worsened-suspect some sort of a abscess or bacterial infection of the spine which probably spread to the brain causing meningitis/ventriculitis.  Impression: Toxic metabolic encephalopathy-multifactorial including meningitis/ventriculitis as well as bacteremia Evaluate for spinal abscess New A. fib with RVR. No evidence of stroke on MRI No evidence of seizure activity or focality on EEG Transaminitis Thrombocytopenia AKI  Recommendations: MRI thoracic and lumbar spine. Consider ID consult Continue broad-spectrum antibiotic coverage. Correction of toxic metabolic derangements per primary team as you are.  Discussed with Dr. Lynetta Mare in the unit.  Brief curbside discussion with Dr. Johnnye Sima as well.  -- Amie Portland, MD [facility] Pager: [phone] If 7pm to 7am, please call on call as listed on AMION.

## 2020-01-05 ENCOUNTER — Inpatient Hospital Stay (HOSPITAL_COMMUNITY): Payer: Medicare Other

## 2020-01-05 DIAGNOSIS — M0029 Other streptococcal polyarthritis: Secondary | ICD-10-CM

## 2020-01-05 DIAGNOSIS — I4811 Longstanding persistent atrial fibrillation: Secondary | ICD-10-CM

## 2020-01-05 DIAGNOSIS — D696 Thrombocytopenia, unspecified: Secondary | ICD-10-CM | POA: Diagnosis present

## 2020-01-05 DIAGNOSIS — M4645 Discitis, unspecified, thoracolumbar region: Secondary | ICD-10-CM | POA: Diagnosis present

## 2020-01-05 DIAGNOSIS — I4891 Unspecified atrial fibrillation: Secondary | ICD-10-CM | POA: Diagnosis present

## 2020-01-05 DIAGNOSIS — B9562 Methicillin resistant Staphylococcus aureus infection as the cause of diseases classified elsewhere: Secondary | ICD-10-CM

## 2020-01-05 DIAGNOSIS — G0490 Encephalitis and encephalomyelitis, unspecified: Secondary | ICD-10-CM

## 2020-01-05 DIAGNOSIS — G039 Meningitis, unspecified: Secondary | ICD-10-CM | POA: Diagnosis present

## 2020-01-05 DIAGNOSIS — G9340 Encephalopathy, unspecified: Secondary | ICD-10-CM

## 2020-01-05 LAB — GLUCOSE, CAPILLARY
Glucose-Capillary: 162 mg/dL — ABNORMAL HIGH (ref 70–99)
Glucose-Capillary: 167 mg/dL — ABNORMAL HIGH (ref 70–99)
Glucose-Capillary: 170 mg/dL — ABNORMAL HIGH (ref 70–99)
Glucose-Capillary: 174 mg/dL — ABNORMAL HIGH (ref 70–99)
Glucose-Capillary: 183 mg/dL — ABNORMAL HIGH (ref 70–99)
Glucose-Capillary: 185 mg/dL — ABNORMAL HIGH (ref 70–99)
Glucose-Capillary: 208 mg/dL — ABNORMAL HIGH (ref 70–99)
Glucose-Capillary: 226 mg/dL — ABNORMAL HIGH (ref 70–99)
Glucose-Capillary: 232 mg/dL — ABNORMAL HIGH (ref 70–99)
Glucose-Capillary: 249 mg/dL — ABNORMAL HIGH (ref 70–99)
Glucose-Capillary: 252 mg/dL — ABNORMAL HIGH (ref 70–99)
Glucose-Capillary: 252 mg/dL — ABNORMAL HIGH (ref 70–99)
Glucose-Capillary: 272 mg/dL — ABNORMAL HIGH (ref 70–99)

## 2020-01-05 LAB — COMPREHENSIVE METABOLIC PANEL WITH GFR
ALT: 49 U/L — ABNORMAL HIGH (ref 0–44)
AST: 56 U/L — ABNORMAL HIGH (ref 15–41)
Albumin: 1.3 g/dL — ABNORMAL LOW (ref 3.5–5.0)
Alkaline Phosphatase: 153 U/L — ABNORMAL HIGH (ref 38–126)
Anion gap: 6 (ref 5–15)
BUN: 35 mg/dL — ABNORMAL HIGH (ref 8–23)
CO2: 20 mmol/L — ABNORMAL LOW (ref 22–32)
Calcium: 7.5 mg/dL — ABNORMAL LOW (ref 8.9–10.3)
Chloride: 116 mmol/L — ABNORMAL HIGH (ref 98–111)
Creatinine, Ser: 0.92 mg/dL (ref 0.61–1.24)
GFR calc Af Amer: >60 mL/min
GFR calc non Af Amer: >60 mL/min
Glucose, Bld: 323 mg/dL — ABNORMAL HIGH (ref 70–99)
Potassium: 4.7 mmol/L (ref 3.5–5.1)
Sodium: 142 mmol/L (ref 135–145)
Total Bilirubin: 2.7 mg/dL — ABNORMAL HIGH (ref 0.3–1.2)
Total Protein: 5.3 g/dL — ABNORMAL LOW (ref 6.5–8.1)

## 2020-01-05 LAB — CBC WITH DIFFERENTIAL/PLATELET
Abs Immature Granulocytes: 0.32 10*3/uL — ABNORMAL HIGH (ref 0.00–0.07)
Basophils Absolute: 0 10*3/uL (ref 0.0–0.1)
Basophils Relative: 0 %
Eosinophils Absolute: 0.1 10*3/uL (ref 0.0–0.5)
Eosinophils Relative: 1 %
HCT: 29 % — ABNORMAL LOW (ref 39.0–52.0)
Hemoglobin: 9.6 g/dL — ABNORMAL LOW (ref 13.0–17.0)
Immature Granulocytes: 2 %
Lymphocytes Relative: 8 %
Lymphs Abs: 1.1 10*3/uL (ref 0.7–4.0)
MCH: 33.6 pg (ref 26.0–34.0)
MCHC: 33.1 g/dL (ref 30.0–36.0)
MCV: 101.4 fL — ABNORMAL HIGH (ref 80.0–100.0)
Monocytes Absolute: 0.5 10*3/uL (ref 0.1–1.0)
Monocytes Relative: 4 %
Neutro Abs: 11.8 10*3/uL — ABNORMAL HIGH (ref 1.7–7.7)
Neutrophils Relative %: 85 %
Platelets: 94 10*3/uL — ABNORMAL LOW (ref 150–400)
RBC: 2.86 MIL/uL — ABNORMAL LOW (ref 4.22–5.81)
RDW: 14.4 % (ref 11.5–15.5)
WBC: 13.9 10*3/uL — ABNORMAL HIGH (ref 4.0–10.5)
nRBC: 0 % (ref 0.0–0.2)

## 2020-01-05 LAB — CSF CULTURE W GRAM STAIN

## 2020-01-05 LAB — TRIGLYCERIDES: Triglycerides: 233 mg/dL — ABNORMAL HIGH

## 2020-01-05 LAB — VANCOMYCIN, TROUGH: Vancomycin Tr: 24 ug/mL (ref 15–20)

## 2020-01-05 IMAGING — MR MR CERVICAL SPINE WO/W CM
6 of 9 series · 19 of 48 positions shown · IV contrast (gadavist)
Comparison: None.

CLINICAL DATA: Sepsis

EXAM:
MRI CERVICAL SPINE WITHOUT AND WITH CONTRAST
TECHNIQUE: Multiplanar and multiecho pulse sequences of the cervical spine, to
include the craniocervical junction and cervicothoracic junction,
were obtained without and with intravenous contrast.
CONTRAST:  10mL GADAVIST GADOBUTROL 1 MMOL/ML IV SOLN

[Series 2: T2 · sagittal · 3.0mm · 0.43mm/px · 2 of 17 slices shown (1 of 2)]
[im 1/17]
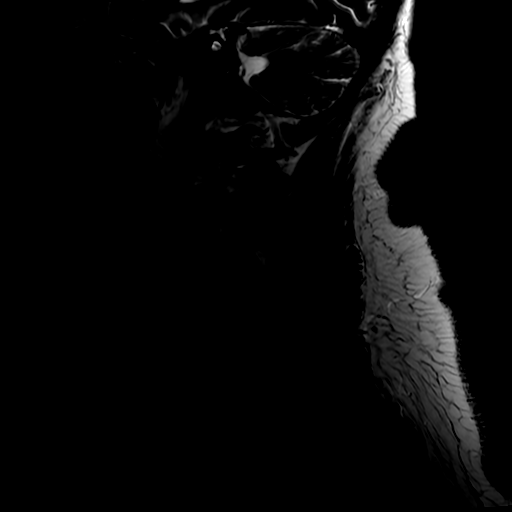
[im 17/17]
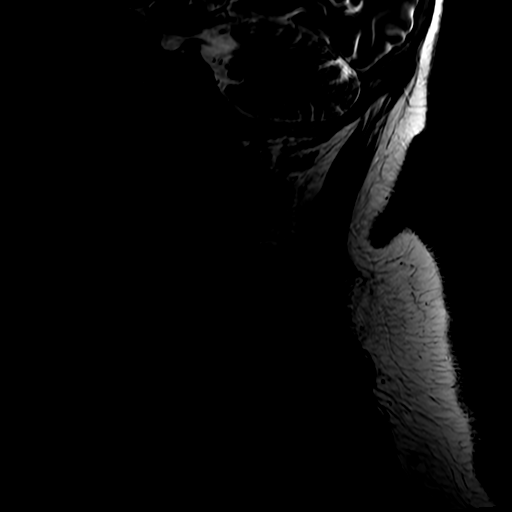

[Series 4: STIR · sagittal · 3.0mm · 0.43mm/px · 2 of 17 slices shown]
[im 1/17]
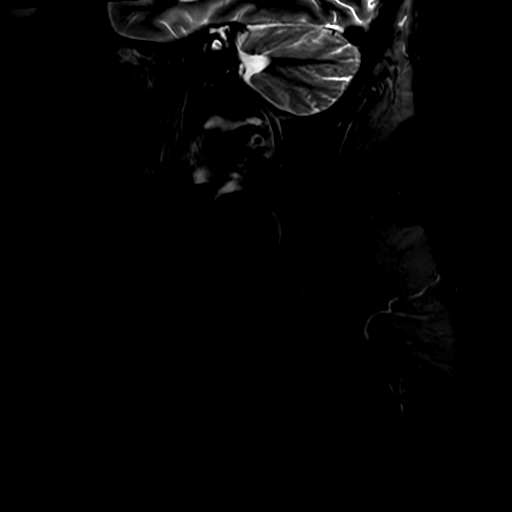
[im 17/17]
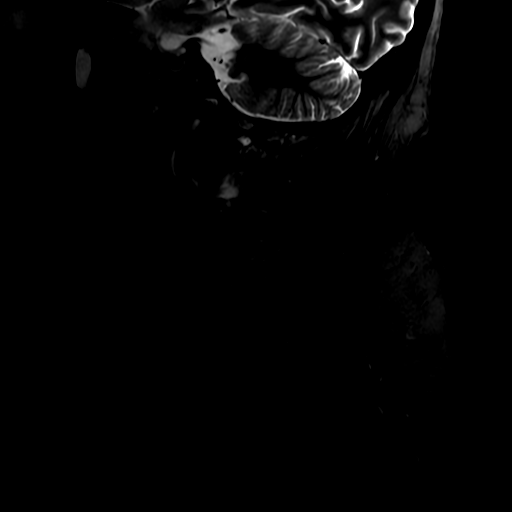

[Series 6: T2 · axial · 3.0mm · 0.35mm/px · z∈[-130,-2]mm · 6 of 39 slices shown (2 of 2)]
[im 1/39]
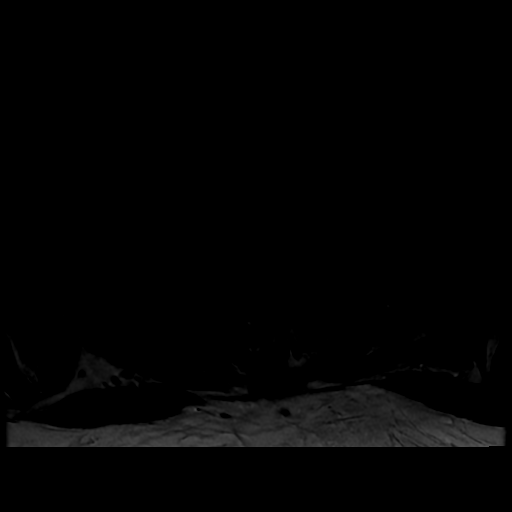
[im 8/39]
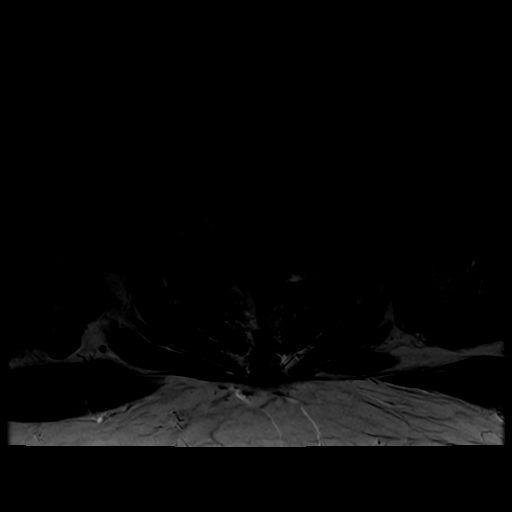
[im 16/39]
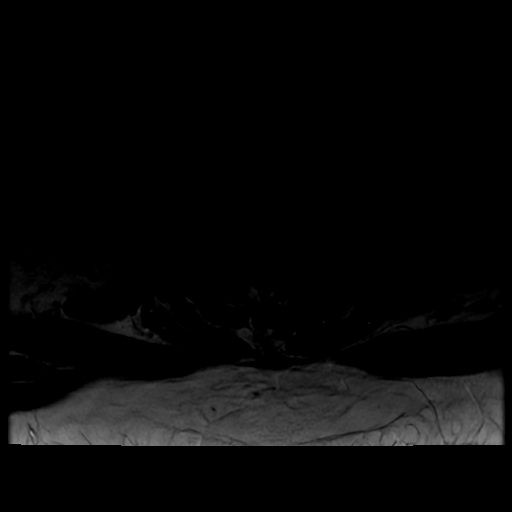
[im 23/39]
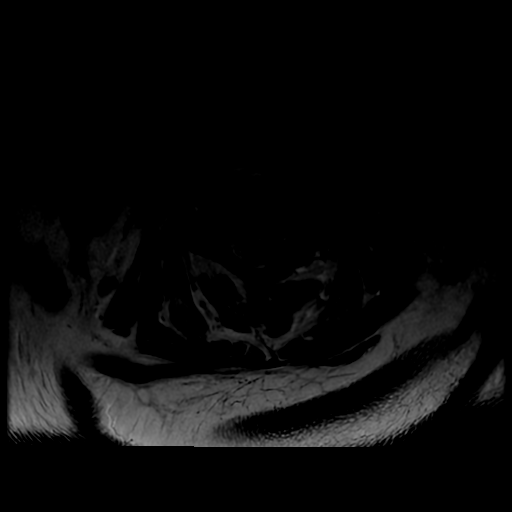
[im 31/39]
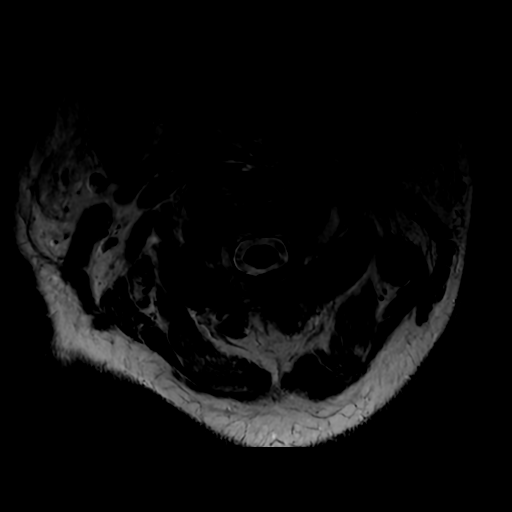
[im 39/39]
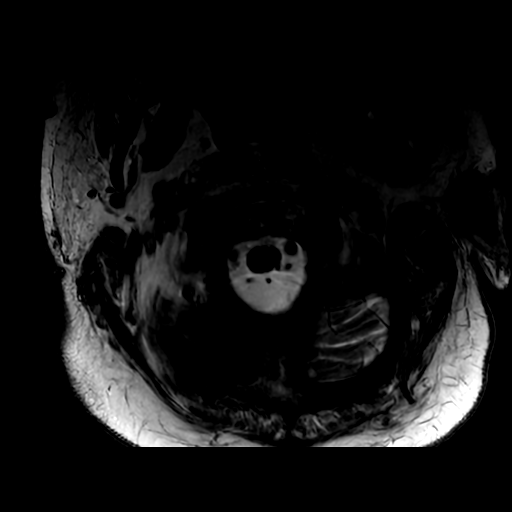

[Series 7: T1 · axial · non-contrast · 3.0mm · 0.35mm/px · z∈[-133,-1]mm · 6 of 40 slices shown]
[im 1/40]
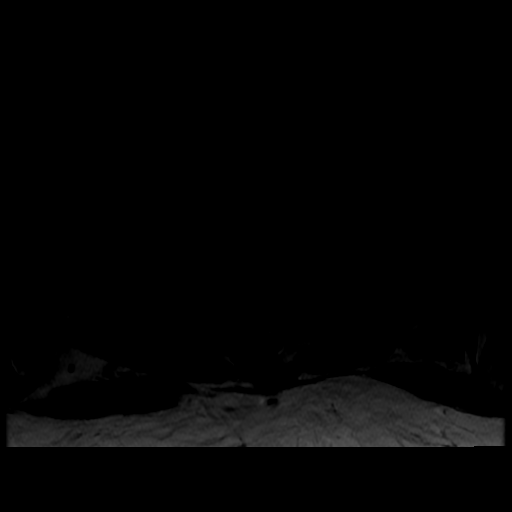
[im 8/40]
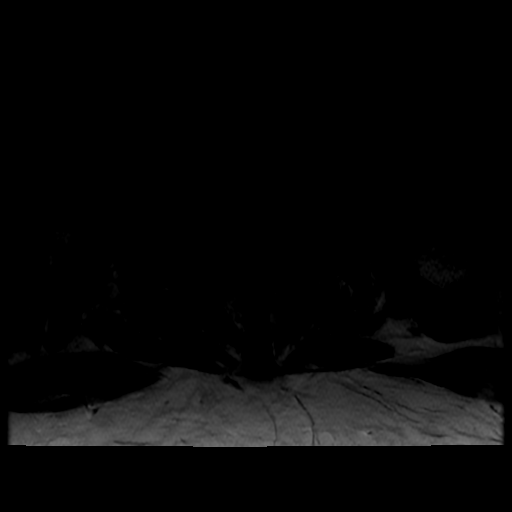
[im 16/40]
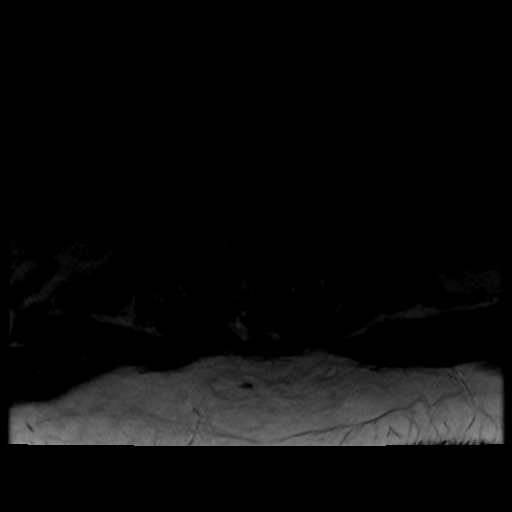
[im 24/40]
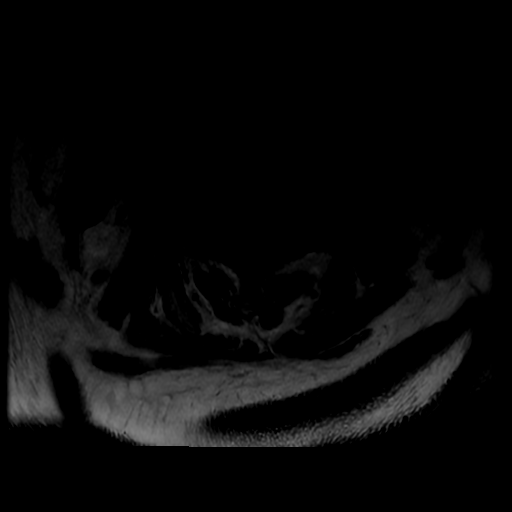
[im 32/40]
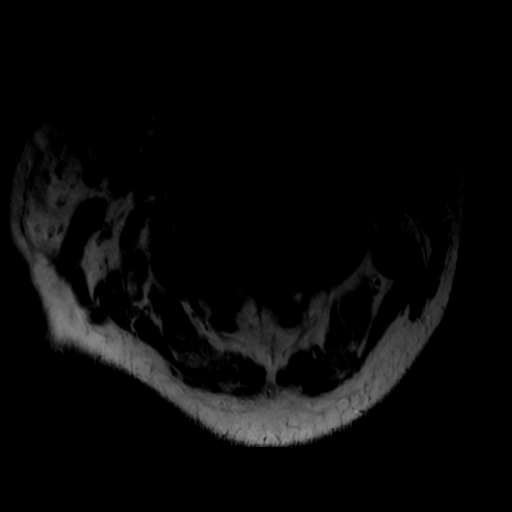
[im 40/40]
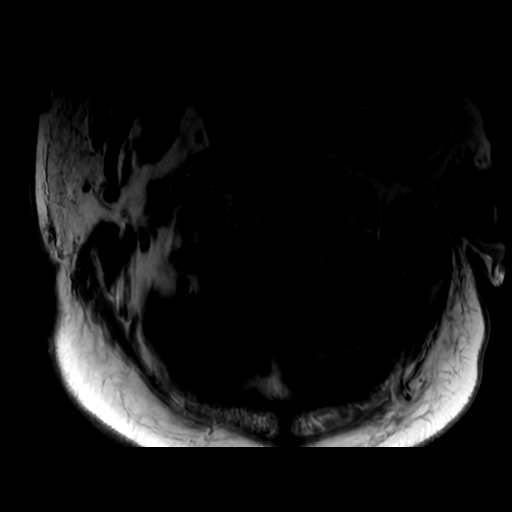

[Series 8: T1 fat-sat post-contrast · sagittal · 3.0mm · 0.43mm/px · 2 of 17 slices shown]
[im 1/17]
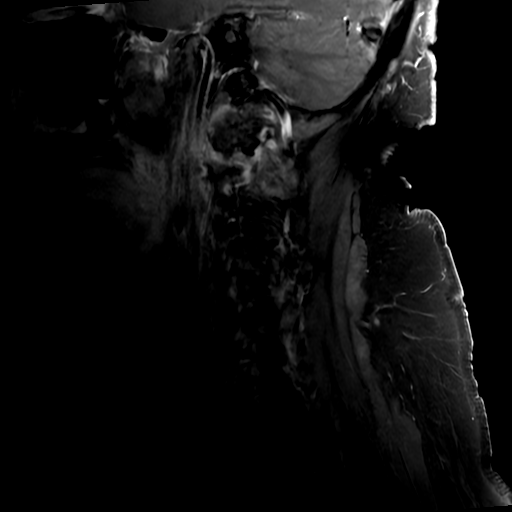
[im 17/17]
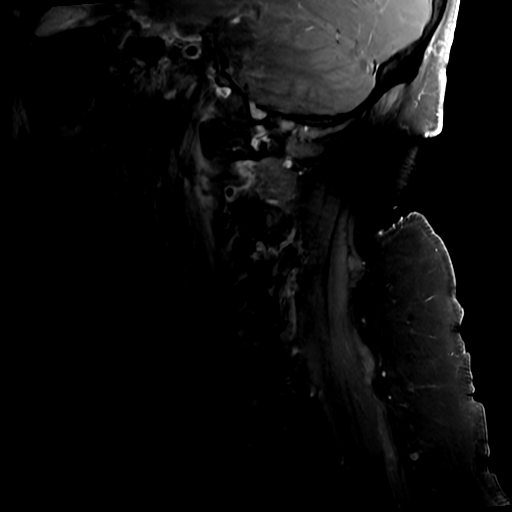

[Series 9: T1 post-contrast · axial · 3.0mm · 0.35mm/px · 1 of 40 slices shown]
[im 1/40]
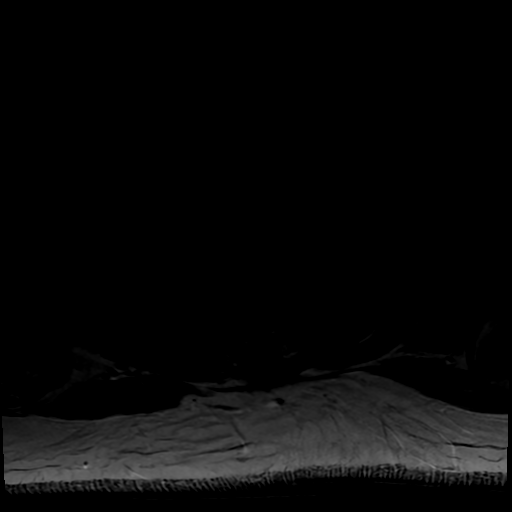

[19 of 48 positions shown; findings below may reference images not displayed]

FINDINGS: Motion artifact is present.

Alignment: Anteroposterior alignment is maintained.

Vertebrae: Vertebral body heights are preserved. Marrow edema and
enhancement at the right C4-C5, right C6-C7, and left C3-C4 facets.

Cord: No abnormal signal within the above limitation. There is no
epidural fluid collection. No abnormal intrathecal enhancement.

Posterior Fossa, vertebral arteries, paraspinal tissues: Mild
interspinous edema and enhancement from C4 to C7. Mild prevertebral
edema and enhancement.

Disc levels:

Disc bulges, endplate osteophytes, and uncovertebral and facet
hypertrophy, greatest at C4-C5 through C6-C7. There is no high-grade
canal stenosis. Foraminal stenosis is present but difficult to
quantify this degraded study.
IMPRESSION: Motion degraded study.

No evidence of epidural abscess or discitis.

Mild marrow edema and enhancement associated with a few facets,
which may be on a degenerative basis or reflect septic arthritis.

## 2020-01-05 MED ORDER — VANCOMYCIN HCL 1250 MG/250ML IV SOLN
1250.0000 mg | Freq: Two times a day (BID) | INTRAVENOUS | Status: DC
  Administered 2020-01-05 – 2020-01-08 (×6): 1250 mg via INTRAVENOUS
  Filled 2020-01-05 (×6): qty 250

## 2020-01-05 MED ORDER — DEXTROSE 10 % IV SOLN: INTRAVENOUS | Status: DC | PRN

## 2020-01-05 MED ORDER — SODIUM CHLORIDE 0.9 % IV SOLN: INTRAVENOUS | Status: DC

## 2020-01-05 MED ORDER — INSULIN ASPART 100 UNIT/ML ~~LOC~~ SOLN
8.0000 [IU] | Freq: Once | SUBCUTANEOUS | Status: AC
  Administered 2020-01-05: 8 [IU] via SUBCUTANEOUS

## 2020-01-05 MED ORDER — INSULIN DETEMIR 100 UNIT/ML ~~LOC~~ SOLN
31.0000 [IU] | Freq: Two times a day (BID) | SUBCUTANEOUS | Status: DC
  Administered 2020-01-06 – 2020-01-07 (×3): 31 [IU] via SUBCUTANEOUS
  Filled 2020-01-05 (×5): qty 0.31

## 2020-01-05 MED ORDER — SODIUM CHLORIDE 0.9 % IV SOLN
600.0000 mg | INTRAVENOUS | Status: DC
  Administered 2020-01-05 – 2020-01-08 (×4): 600 mg via INTRAVENOUS
  Filled 2020-01-05 (×4): qty 600

## 2020-01-05 MED ORDER — ATORVASTATIN CALCIUM 10 MG PO TABS
10.0000 mg | ORAL_TABLET | Freq: Every day | ORAL | Status: DC
  Administered 2020-01-05 – 2020-01-08 (×4): 10 mg
  Filled 2020-01-05 (×4): qty 1

## 2020-01-05 MED ORDER — INSULIN ASPART 100 UNIT/ML ~~LOC~~ SOLN
10.0000 [IU] | SUBCUTANEOUS | Status: DC
  Administered 2020-01-06: 10 [IU] via SUBCUTANEOUS

## 2020-01-05 MED ORDER — GADOBUTROL 1 MMOL/ML IV SOLN
10.0000 mL | Freq: Once | INTRAVENOUS | Status: AC | PRN
  Administered 2020-01-05: 10 mL via INTRAVENOUS

## 2020-01-05 MED ORDER — DEXTROSE-NACL 5-0.45 % IV SOLN: INTRAVENOUS | Status: DC

## 2020-01-05 MED ORDER — ASPIRIN 81 MG PO CHEW
81.0000 mg | CHEWABLE_TABLET | Freq: Every day | ORAL | Status: DC
  Administered 2020-01-05 – 2020-01-08 (×4): 81 mg
  Filled 2020-01-05 (×4): qty 1

## 2020-01-05 MED ORDER — INSULIN ASPART 100 UNIT/ML ~~LOC~~ SOLN
3.0000 [IU] | SUBCUTANEOUS | Status: DC
  Administered 2020-01-06: 9 [IU] via SUBCUTANEOUS
  Administered 2020-01-06 (×2): 6 [IU] via SUBCUTANEOUS
  Administered 2020-01-06: 9 [IU] via SUBCUTANEOUS
  Administered 2020-01-07: 3 [IU] via SUBCUTANEOUS
  Administered 2020-01-07: 6 [IU] via SUBCUTANEOUS

## 2020-01-05 MED ORDER — INSULIN REGULAR(HUMAN) IN NACL 100-0.9 UT/100ML-% IV SOLN
INTRAVENOUS | Status: DC
  Administered 2020-01-05: 17 [IU]/h via INTRAVENOUS
  Filled 2020-01-05 (×2): qty 100

## 2020-01-05 MED ORDER — DEXMEDETOMIDINE HCL IN NACL 400 MCG/100ML IV SOLN
0.4000 ug/kg/h | INTRAVENOUS | Status: DC
  Administered 2020-01-05 (×3): 1.2 ug/kg/h via INTRAVENOUS
  Administered 2020-01-05: 1 ug/kg/h via INTRAVENOUS
  Administered 2020-01-06 – 2020-01-07 (×11): 1.2 ug/kg/h via INTRAVENOUS
  Administered 2020-01-07: 1 ug/kg/h via INTRAVENOUS
  Administered 2020-01-07 (×8): 1.2 ug/kg/h via INTRAVENOUS
  Administered 2020-01-08 – 2020-01-09 (×12): 1 ug/kg/h via INTRAVENOUS
  Filled 2020-01-05 (×27): qty 100
  Filled 2020-01-05: qty 200
  Filled 2020-01-05 (×8): qty 100

## 2020-01-05 MED ORDER — PRO-STAT SUGAR FREE PO LIQD
30.0000 mL | Freq: Four times a day (QID) | ORAL | Status: DC
  Administered 2020-01-05 – 2020-01-08 (×10): 30 mL
  Filled 2020-01-05 (×10): qty 30

## 2020-01-05 MED ORDER — DEXTROSE 50 % IV SOLN: 0.0000 mL | INTRAVENOUS | Status: DC | PRN

## 2020-01-05 MED ORDER — VITAL HIGH PROTEIN PO LIQD
1000.0000 mL | ORAL | Status: DC
  Administered 2020-01-05 – 2020-01-07 (×3): 1000 mL
  Filled 2020-01-05: qty 1000

## 2020-01-05 NOTE — Progress Notes (Signed)
Assisted tele visit to patient with son.  Andros Channing M, RN  

## 2020-01-05 NOTE — Progress Notes (Signed)
Patient transported to MRI and back to room 2M16 without complications.

## 2020-01-05 NOTE — Progress Notes (Signed)
eLink Physician-Brief Progress Note Patient Name: Isaac Hamilton DOB: 08/27/1950 MRN: [id number]   Date of Service  01/05/2020  HPI/Events of Note  9 PM blood glucose = 170 on 6.5 units/hour, 10 PM blood glucose = 167 on 7 units/hour and 11 PM blood glucose = 183 on 8.5 units/hour.   eICU Interventions  Plan: 1. Transition to Levemir 31 units Q 12 hours + Novolog SSI.      Intervention Category Major Interventions: Hyperglycemia - active titration of insulin therapy  Lenell Antu 01/05/2020, 11:38 PM

## 2020-01-05 NOTE — Progress Notes (Addendum)
Patient ID: Isaac Hamilton, male   DOB: [birth year], 69 y.o.   MRN: [id number]         [facility] for Infectious Disease  Date of Admission:  01/01/2020   Total days of antibiotics 7         ASSESSMENT: He has MRSA bacteremia complicated by thoracolumbar infection and meningitis.  I will continue vancomycin and add rifampin now.  The presence of meningitis gives him a very poor prognosis.  There is no urgent need to pursue TEE. PLAN: 1. Continue vancomycin 2. Add rifampin  Principal Problem:   MRSA bacteremia Active Problems:   Meningitis   Discitis of thoracolumbar region   Encounter for intubation   Morbid obesity ([place])   Thrombocytopenia ([place])   Atrial fibrillation ([place])   Scheduled Meds: . amiodarone  150 mg Intravenous Once  . chlorhexidine gluconate (MEDLINE KIT)  15 mL Mouth Rinse BID  . Chlorhexidine Gluconate Cloth  6 each Topical Daily  . docusate  100 mg Per Tube BID  . feeding supplement (PRO-STAT SUGAR FREE 64)  30 mL Per Tube BID  . feeding supplement (VITAL HIGH PROTEIN)  1,000 mL Per Tube Q24H  . fentaNYL (SUBLIMAZE) injection  25 mcg Intravenous Once  . heparin injection (subcutaneous)  5,000 Units Subcutaneous Q8H  . mouth rinse  15 mL Mouth Rinse 10 times per day  . pantoprazole (PROTONIX) IV  40 mg Intravenous QHS  . polyethylene glycol  17 g Per Tube Daily  . potassium chloride  40 mEq Oral BID  . sodium chloride flush  10-40 mL Intracatheter Q12H   Continuous Infusions: . sodium chloride    . amiodarone 30 mg/hr (01/05/20 0600)  . dextrose 5 % and 0.45% NaCl    . diltiazem (CARDIZEM) infusion 15 mg/hr (01/05/20 0750)  . fentaNYL infusion INTRAVENOUS 200 mcg/hr (01/05/20 0947)  . insulin    . propofol (DIPRIVAN) infusion 50 mcg/kg/min (01/05/20 0811)  . vancomycin 1,500 mg (01/05/20 0840)   PRN Meds:.acetaminophen (TYLENOL) oral liquid 160 mg/5 mL, dextrose, docusate sodium, fentaNYL, polyethylene glycol, sodium chloride flush  Review of  Systems: Review of Systems  Unable to perform ROS: Intubated    No Known Allergies  OBJECTIVE: Vitals:   01/05/20 0354 01/05/20 0400 01/05/20 0500 01/05/20 0600  BP:  (!) 126/58 (!) 137/58 135/61  Pulse:  96 95 98  Resp:  (!) 22 (!) 24 (!) 23  Temp:  (!) 100.4 F (38 C) (!) 100.4 F (38 C) (!) 100.4 F (38 C)  TempSrc:      SpO2:  100% 100% 100%  Weight: (!) 149 kg     Height:       Body mass index is 47.13 kg/m.  Physical Exam Constitutional:      Comments: He is unresponsive on the ventilator.  Cardiovascular:     Comments: Very distant heart sounds. Pulmonary:     Breath sounds: Normal breath sounds.  Abdominal:     Palpations: Abdomen is soft.  Musculoskeletal:        General: No swelling or tenderness.     Comments: He has a healed incision over his left knee.  Skin:    Findings: No rash.     Lab Results Lab Results  Component Value Date   WBC 13.9 (H) 01/05/2020   HGB 9.6 (L) 01/05/2020   HCT 29.0 (L) 01/05/2020   MCV 101.4 (H) 01/05/2020   PLT 94 (L) 01/05/2020    Lab Results  Component Value  Date   CREATININE 0.92 01/05/2020   BUN 35 (H) 01/05/2020   NA 142 01/05/2020   K 4.7 01/05/2020   CL 116 (H) 01/05/2020   CO2 20 (L) 01/05/2020    Lab Results  Component Value Date   ALT 49 (H) 01/05/2020   AST 56 (H) 01/05/2020   ALKPHOS 153 (H) 01/05/2020   BILITOT 2.7 (H) 01/05/2020     Microbiology: Recent Results (from the past 240 hour(s))  Urine culture     Status: None   Collection Time: 12/25/2019  4:10 PM   Specimen: Urine, Random  Result Value Ref Range Status   Specimen Description URINE, RANDOM  Final   Special Requests NONE  Final   Culture   Final    NO GROWTH Performed at [facility], [address] [place], [place] [address]    Report Status 01/04/2020 FINAL  Final  Culture, blood (routine x 2)     Status: None (Preliminary result)   Collection Time: 12/27/2019  6:08 PM   Specimen: BLOOD RIGHT HAND  Result Value Ref  Range Status   Specimen Description BLOOD RIGHT HAND  Final   Special Requests   Final    BOTTLES DRAWN AEROBIC AND ANAEROBIC Blood Culture adequate volume   Culture   Final    NO GROWTH 3 DAYS Performed at [facility], [place] [address] [place], [place] [address]    Report Status PENDING  Incomplete  Culture, blood (routine x 2)     Status: None (Preliminary result)   Collection Time: 12/30/2019  6:20 PM   Specimen: BLOOD LEFT HAND  Result Value Ref Range Status   Specimen Description BLOOD LEFT HAND  Final   Special Requests   Final    BOTTLES DRAWN AEROBIC ONLY Blood Culture results may not be optimal due to an inadequate volume of blood received in culture bottles   Culture   Final    NO GROWTH 3 DAYS Performed at [facility], [place] [address], [place], [place] [address]    Report Status PENDING  Incomplete  CSF culture     Status: None   Collection Time: 01/03/20  4:26 PM   Specimen: PATH Cytology CSF; Cerebrospinal Fluid  Result Value Ref Range Status   Specimen Description CSF  Final   Special Requests NONE  Final   Gram Stain   Final    RARE WBC PRESENT, PREDOMINANTLY PMN RARE GRAM POSITIVE COCCI CRITICAL RESULT CALLED TO, READ BACK BY AND VERIFIED WITH: RN Felicity Pellegrini [phone] MLM Performed at [facility], [address], [place],  [address]    Culture FEW METHICILLIN RESISTANT STAPHYLOCOCCUS AUREUS  Final   Report Status 01/05/2020 FINAL  Final   Organism ID, Bacteria METHICILLIN RESISTANT STAPHYLOCOCCUS AUREUS  Final      Susceptibility   Methicillin resistant staphylococcus aureus - MIC*    CIPROFLOXACIN >=8 RESISTANT Resistant     ERYTHROMYCIN >=8 RESISTANT Resistant     GENTAMICIN <=0.5 SENSITIVE Sensitive     OXACILLIN >=4 RESISTANT Resistant     TETRACYCLINE <=1 SENSITIVE Sensitive     VANCOMYCIN 1 SENSITIVE Sensitive     TRIMETH/SULFA <=10 SENSITIVE Sensitive     CLINDAMYCIN RESISTANT Resistant     RIFAMPIN <=0.5 SENSITIVE  Sensitive     Inducible Clindamycin POSITIVE Resistant     * FEW METHICILLIN RESISTANT STAPHYLOCOCCUS AUREUS  Culture, respiratory (tracheal aspirate)     Status: None (Preliminary result)  Collection Time: 01/03/20  8:52 PM   Specimen: Tracheal Aspirate; Respiratory  Result Value Ref Range Status   Specimen Description TRACHEAL ASPIRATE  Final   Special Requests NONE  Final   Gram Stain   Final    RARE WBC PRESENT, PREDOMINANTLY PMN NO ORGANISMS SEEN    Culture   Final    NO GROWTH 2 DAYS Performed at [facility], [place] [address], [place], [place] [address]    Report Status PENDING  Incomplete  Culture, blood (Routine X 2) w Reflex to ID Panel     Status: None (Preliminary result)   Collection Time: 01/04/20  7:34 AM   Specimen: BLOOD RIGHT HAND  Result Value Ref Range Status   Specimen Description BLOOD RIGHT HAND  Final   Special Requests   Final    BOTTLES DRAWN AEROBIC ONLY Blood Culture adequate volume   Culture   Final    NO GROWTH < 24 HOURS Performed at [facility], [place] [address] [place], [place] [address]    Report Status PENDING  Incomplete  Culture, blood (Routine X 2) w Reflex to ID Panel     Status: None (Preliminary result)   Collection Time: 01/04/20  7:34 AM   Specimen: BLOOD LEFT HAND  Result Value Ref Range Status   Specimen Description BLOOD LEFT HAND  Final   Special Requests   Final    BOTTLES DRAWN AEROBIC ONLY Blood Culture adequate volume   Culture   Final    NO GROWTH < 24 HOURS Performed at [facility], [place] [address] [place], [place] [address]    Report Status PENDING  Incomplete    Michel Bickers, MD [facility] for Infectious [place] [facility] [phone] pager   [phone] cell 01/05/2020, 9:48 AM

## 2020-01-05 NOTE — Progress Notes (Signed)
Pharmacy Antibiotic Note  Isaac Hamilton is a 69 y.o. male transferred to [facility] from [facility] on 01-31-2020 with MRSA bacteremia and mental status changes, with concern for meningitis and endocarditis. Hospitalization at [facility] was complicated by thrombocytopenia; TEE not pursed there due to thrombocytopenia. LP not able to be done at OSH, due to pt's body habitus at OSH. Pharmacy has been consulted for vancomycin dosing for bacteremia/meningitis, and ceftriaxone/ampicillin for meningitis.  Vancomycin trough today = 24 on 1500 mg q 12 hrs.  Plan: -Reduce vancomycin to 1250mg  IV q12h -Follow renal function closely -Recheck trough at new Css   Height: 5\' 10"  (177.8 cm) Weight: (!) 149 kg (328 lb 7.8 oz) IBW/kg (Calculated) : 73  Temp (24hrs), Avg:100.8 F (38.2 C), Min:100.4 F (38 C), Max:101.3 F (38.5 C)  Recent Labs  Lab 01/31/2020 1800 01/31/20 1911 01/03/20 0508 01/03/20 1324 01/03/20 1804 01/04/20 0734 01/05/20 0348 01/05/20 1945  WBC 23.4*  --  23.6*  --   --  13.6* 13.9*  --   CREATININE 0.87  --  1.12 1.09  --  0.93 0.92  --   LATICACIDVEN 2.4* 2.4*  --   --   --   --   --   --   VANCOTROUGH  --   --   --   --  26*  --   --  24*    Estimated Creatinine Clearance: 112.4 mL/min (by C-G formula based on SCr of 0.92 mg/dL).    No Known Allergies  Antimicrobials this admission: 6/18 vancomycin >> 6/18 ceftriaxone >> 6/18 ampicillin >>  Microbiology results: 6/18 BC: ng x3 days  6/18 Ucx: ng  6/19 Resp cx: ng x2 days 6/19 CSF: MRSA   6/20 Bcx: ngtd  Cultures at OSH (per pharmacist): 6/13 Bld cx: MRSA  6/15 Bld cx: GPC in clusters 6/17 Bld cx: GPC in clusters   Thank you for allowing pharmacy to be a part of this patient's care.  7/15, 7/17, [facility] Clinical Pharmacist  01/05/2020 8:54 PM   [facility] pharmacy phone numbers are listed on [email or web address]

## 2020-01-05 NOTE — Progress Notes (Signed)
NAME:  Isaac Hamilton, MRN:  [id number], DOB:  October 14, 1950, LOS: 3 ADMISSION DATE:  12/24/2019, CONSULTATION DATE:  01/11/2020 REFERRING MD:  Lorin Mercy - TRH, CHIEF COMPLAINT:  Low back pain, fatigue , AMS + MRSA Bacteremia  Brief History   69 yo M transferred to [facility] to [facility] service 6/18. Upon arrival, unresponsive. PCCM consulted for evaluation. Neurology consulted as code stroke   History of present illness   69 yo M PMH DM, Afib, hx MRSA who transferred to [facility] from [place] with reported MRSA septicemia and inability to obtain MRI due to imaging; admitted to [facility] service, reportedly AOOx4 prior to transfer. Admitted to [facility] for possible back strain in setting of helping sig other move a scooter. Found to have MRSA bacteremia, and worsening malaise, fatigue. Hospitalization at [facility] complicated by AKI, thrombocytopenia, transaminitis, toxic encephalopathy. TEE not pursued due to thrombocytopenia.  Arrives to [facility] on dilt gtt, and unresponsive with L sided weakness. Concern that patient is not protecting airway; PCCM consulted and neurology paged for "code stroke."   PCCM and neurology at bedside with rapid response and RRT.    Past Medical History  DM2 Afib Chronic back pain HLD OA HTN Morbid obesity   Significant Hospital Events   6/18 arrives to [facility], unresponsive.   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  Phlegmon at L3-S1 without discrete abscess.  No definite evidence of discitis.  Mod to severe spinal stenosis and foraminal stenosis.  Micro Data:  Attempted to wean propofol to perform neuro exam but got agitated.  Will retry later this morning.  Antimicrobials:  Vanc 6/18 >>  Interim history/subjective:  Became agitated when propofol was  weaned down.  Tolerating PSV otherwise.  Objective   Blood pressure 135/61, pulse 98, temperature (!) 100.4 F (38 C), resp. rate (!) 23, height 5\' 10"  (1.778 m), weight (!) 149 kg, SpO2 100 %.    Vent Mode: PSV;CPAP FiO2 (%):  [40 %] 40 % Set Rate:  [15 bmp] 15 bmp Vt Set:  [580 mL] 580 mL PEEP:  [5 cmH20] 5 cmH20 Pressure Support:  [5 cmH20] 5 cmH20 Plateau Pressure:  [16 cmH20-19 cmH20] 17 cmH20   Intake/Output Summary (Last 24 hours) at 01/05/2020 0951 Last data filed at 01/05/2020 0600 Gross per 24 hour  Intake 3025.28 ml  Output 2350 ml  Net 675.28 ml   Filed Weights   01/03/20 0500 01/04/20 0326 01/05/20 0354  Weight: (!) 142 kg (!) 151 kg (!) 149 kg    Examination: General: Adult male, critically ill, resting in bed, in NAD. Neuro: Sedated, not following commands. HEENT: [place]/AT. Sclerae anicteric. ETT in place. Cardiovascular: RRR, no M/R/G.  Lungs: Respirations even and unlabored.  CTA bilaterally, No W/R/R. Abdomen: Obese.  BS x 4, soft, NT/ND.  Musculoskeletal: No gross deformities, no edema.  Skin: Intact, warm, no rashes.   Assessment & Plan:   Critically ill due to acute respiratory failure in setting of encephalopathy and poor airway protection, requiring intubation, requiring mechanical ventilation.  - Continue SBT, hopefully can wean his sedation down and if mental status allows then consider extubation (will likely be at least another day or so). - Continue bronchial hygiene.  Acute Encephalopathy likely toxic metabolic encephalopathy in setting of severe sepsis due to MRSA  bacteremia and meningitis. Septic spinal facet arthritis with phlegmon but no definitive abscess or discitis. - Continue vancomycin and rifampin per ID. - Dr. Chestine Spore discussed with ID, no role for TEE right now. - PICC once blood cultures are negative.  Critically ill due to atrial fibrillation with rapid ventricular response, requiring amiodarone and diltiazem infusions for rate  control - now in sinus tach. - Continue amio and dilt for now. - Hold off on heparin / anticoagulation given sinus rhythm now. - Maintain potassium greater than 4 mmol/L.  Hx DM. - Start insulin gtt. - Hold home glipizide, metformin.  Best practice:  Diet: Initiate tube feeding Pain/Anxiety/Delirium protocol (if indicated): Continue propofol and fentanyl.  Daily wake up assessment. VAP protocol (if indicated): Bundle in place. DVT prophylaxis: SCDs/Heparin GI prophylaxis: protonix  Glucose control: SSI  Mobility: BR Code Status: Full  Family Communication: Will call Disposition: ICU    CC time: 35 min.   Rutherford Guys, [place] [facility] 01/05/2020, 10:09 AM

## 2020-01-05 NOTE — Progress Notes (Signed)
Initial Nutrition Assessment  DOCUMENTATION CODES:   Morbid obesity  INTERVENTION:   Continue tube feeding via OGT: Increase Vital High Protein to 55 ml/h (1320 ml per day) Increase Pro-stat to 30 ml QID  Provides 1720 kcal, 176 gm protein, 1104 ml free water daily  NUTRITION DIAGNOSIS:   Inadequate oral intake related to inability to eat as evidenced by NPO status.  GOAL:   Provide needs based on ASPEN/SCCM guidelines  MONITOR:   Vent status, Labs, TF tolerance, Skin  REASON FOR ASSESSMENT:   Ventilator, Consult Enteral/tube feeding initiation and management  ASSESSMENT:   69 yo male admitted from [facility] with MRSA bacteremia, AMS. Intubated on admission. PMH includes DM, A fib, chronic back pain, HLD, OA, HTN, morbid obesity.   Discussed patient in ICU rounds and with RN today. Plans to d/c propofol today, changing to Precedex. Received MD Consult for TF initiation and management. OG tube in place. Currently receiving Vital High Protein at 40 ml/h with Pro-stat 30 ml BID to provide 1160 kcal, 114 gm protein, 803 ml free water daily.   Patient is currently intubated on ventilator support MV: 16 L/min Temp (24hrs), Avg:100.6 F (38.1 C), Min:100 F (37.8 C), Max:100.9 F (38.3 C)  Propofol: being discontinued today  Labs reviewed.  CBG: 272-249  Medications reviewed and include colace, miralax, insulin drip, propofol.  Admit weight 142 kg I/O +4.8 L   NUTRITION - FOCUSED PHYSICAL EXAM:    Most Recent Value  Orbital Region No depletion  Upper Arm Region No depletion  Thoracic and Lumbar Region No depletion  Buccal Region No depletion  Temple Region Mild depletion  Clavicle Bone Region No depletion  Clavicle and Acromion Bone Region No depletion  Scapular Bone Region Unable to assess  Dorsal Hand No depletion  Patellar Region No depletion  Anterior Thigh Region No depletion  Posterior Calf Region No depletion  Edema (RD Assessment) Mild   Hair Reviewed  Eyes Unable to assess  Mouth Unable to assess  Skin Reviewed  Nails Reviewed       Diet Order:   Diet Order            Diet NPO time specified  Diet effective now                 EDUCATION NEEDS:   Not appropriate for education at this time  Skin:  Skin Assessment: Skin Integrity Issues: Skin Integrity Issues:: DTI DTI: coccyx  Last BM:  6/12  Height:   Ht Readings from Last 1 Encounters:  01/06/2020 5\' 10"  (1.778 m)    Weight:   Wt Readings from Last 1 Encounters:  01/05/20 (!) 149 kg    Ideal Body Weight:  75.5 kg  BMI:  Body mass index is 47.13 kg/m.  Estimated Nutritional Needs:   Kcal:  1560-1990  Protein:  165-185 gm  Fluid:  2 L    01/07/20, RD, LDN, CNSC Please refer to Amion for contact information.

## 2020-01-05 NOTE — Progress Notes (Addendum)
Neurology Progress Note   S:// Seen and examined along side of Dr Megan Salon this am. We discussed the MRI of the T and L-spine findings.  O:// Current vital signs: BP 135/61   Pulse 98   Temp (!) 100.4 F (38 C)   Resp (!) 23   Ht _0  (1.778 m)   Wt (!) 149 kg   SpO2 100%   BMI 47.13 kg/m  Vital signs in last 24 hours: Temp:  [100 F (37.8 C)-100.9 F (38.3 C)] 100.4 F (38 C) (06/21 0600) Pulse Rate:  [36-120] 98 (06/21 0600) Resp:  [21-29] 23 (06/21 0600) BP: (118-152)/(52-94) 135/61 (06/21 0600) SpO2:  [97 %-100 %] 100 % (06/21 0600) FiO2 (%):  [40 %] 40 % (06/21 0751) Weight:  [149 kg] 149 kg (06/21 0354) Neurological exam Sedated intubated On propofol which was held for examination. No spontaneous movements noted Pupils are equal round reactive light, 51m Gaze is dysconjugate, no tracking  Facial symmetry difficult to ascertain due to the endotracheal tube. Has bilateral corneal reflexes present Breathe over the vent after sedation is held. No movement in any extremities to noxious stimuli, but grimaces to noxious stimulation throught.   Medications  Current Facility-Administered Medications:  .  0.9 %  sodium chloride infusion, , Intravenous, Continuous, Clark, Laura P, DO .  acetaminophen (TYLENOL) 160 MG/5ML solution 650 mg, 650 mg, Per Tube, Q4H PRN, AJudd Lien MD, 650 mg at 01/04/20 2219 .  amiodarone (NEXTERONE) 1.8 mg/mL load via infusion 150 mg, 150 mg, Intravenous, Once **FOLLOWED BY** [EXPIRED] amiodarone (NEXTERONE PREMIX) 360-4.14 MG/200ML-% (1.8 mg/mL) IV infusion, 60 mg/hr, Intravenous, Continuous, Last Rate: 33.3 mL/hr at 01/03/20 0100, 60 mg/hr at 01/03/20 0100 **FOLLOWED BY** amiodarone (NEXTERONE PREMIX) 360-4.14 MG/200ML-% (1.8 mg/mL) IV infusion, 30 mg/hr, Intravenous, Continuous, Bowser, Grace E, NP, Last Rate: 16.67 mL/hr at 01/05/20 0600, 30 mg/hr at 01/05/20 0600 .  chlorhexidine gluconate (MEDLINE KIT) (PERIDEX) 0.12 % solution  15 mL, 15 mL, Mouth Rinse, BID, Icard, Bradley L, DO, 15 mL at 01/05/20 0750 .  Chlorhexidine Gluconate Cloth 2 % PADS 6 each, 6 each, Topical, Daily, AKipp Brood MD, 6 each at 01/05/20 0200 .  dextrose 5 %-0.45 % sodium chloride infusion, , Intravenous, Continuous, Clark, Laura P, DO .  dextrose 50 % solution 0-50 mL, 0-50 mL, Intravenous, PRN, CNoemi ChapelP, DO .  diltiazem (CARDIZEM) 125 mg in dextrose 5% 125 mL (1 mg/mL) infusion, 5-15 mg/hr, Intravenous, Continuous, Ogan, Okoronkwo U, MD, Last Rate: 15 mL/hr at 01/05/20 0750, 15 mg/hr at 01/05/20 0750 .  docusate (COLACE) 50 MG/5ML liquid 100 mg, 100 mg, Per Tube, BID, Icard, Bradley L, DO, 100 mg at 01/04/20 2219 .  docusate sodium (COLACE) capsule 100 mg, 100 mg, Oral, BID PRN, DMerlene LaughterF, NP .  feeding supplement (PRO-STAT SUGAR FREE 64) liquid 30 mL, 30 mL, Per Tube, BID, Agarwala, Ravi, MD, 30 mL at 01/04/20 2219 .  feeding supplement (VITAL HIGH PROTEIN) liquid 1,000 mL, 1,000 mL, Per Tube, Q24H, Agarwala, Ravi, MD, 1,000 mL at 01/04/20 1751 .  fentaNYL (SUBLIMAZE) bolus via infusion 25 mcg, 25 mcg, Intravenous, Q15 min PRN, DMerlene LaughterF, NP, 25 mcg at 01/03/20 1740 .  fentaNYL (SUBLIMAZE) injection 25 mcg, 25 mcg, Intravenous, Once, DRosana Hoes Whitney F, NP .  fentaNYL 25041m in NS 25093m68m13ml) infusion-PREMIX, 25-200 mcg/hr, Intravenous, Continuous, DaviMerlene LaughterNP, Last Rate: 20 mL/hr at 01/05/20 0947, 200 mcg/hr at 01/05/20 0947 .  heparin injection 5,000 Units,  5,000 Units, Subcutaneous, Q8H, Kipp Brood, MD, 5,000 Units at 01/05/20 0512 .  insulin regular, human (MYXREDLIN) 100 units/ 100 mL infusion, , Intravenous, Continuous, Clark, Venita Sheffield, DO .  MEDLINE mouth rinse, 15 mL, Mouth Rinse, 10 times per day, Icard, Bradley L, DO, 15 mL at 01/05/20 0512 .  pantoprazole (PROTONIX) injection 40 mg, 40 mg, Intravenous, QHS, Merlene Laughter F, NP, 40 mg at 01/04/20 2219 .  polyethylene glycol (MIRALAX / GLYCOLAX)  packet 17 g, 17 g, Oral, Daily PRN, Merlene Laughter F, NP .  polyethylene glycol (MIRALAX / GLYCOLAX) packet 17 g, 17 g, Per Tube, Daily, Icard, Bradley L, DO, 17 g at 01/04/20 1102 .  potassium chloride (KLOR-CON) packet 40 mEq, 40 mEq, Oral, BID, Agarwala, Ravi, MD, 40 mEq at 01/04/20 2219 .  propofol (DIPRIVAN) 1000 MG/100ML infusion, 0-50 mcg/kg/min, Intravenous, Continuous, Merlene Laughter F, NP, Last Rate: 42.6 mL/hr at 01/05/20 0811, 50 mcg/kg/min at 01/05/20 0811 .  rifampin (RIFADIN) 600 mg in sodium chloride 0.9 % 100 mL IVPB, 600 mg, Intravenous, Q24H, Michel Bickers, MD .  sodium chloride flush (NS) 0.9 % injection 10-40 mL, 10-40 mL, Intracatheter, Q12H, Icard, Bradley L, DO, 10 mL at 01/04/20 2220 .  sodium chloride flush (NS) 0.9 % injection 10-40 mL, 10-40 mL, Intracatheter, PRN, Icard, Bradley L, DO .  vancomycin (VANCOREADY) IVPB 1500 mg/300 mL, 1,500 mg, Intravenous, Q12H, Einar Grad, RPH, Last Rate: 150 mL/hr at 01/05/20 0840, 1,500 mg at 01/05/20 0840 Labs CBC    Component Value Date/Time   WBC 13.9 (H) 01/05/2020 0348   RBC 2.86 (L) 01/05/2020 0348   HGB 9.6 (L) 01/05/2020 0348   HCT 29.0 (L) 01/05/2020 0348   PLT 94 (L) 01/05/2020 0348   MCV 101.4 (H) 01/05/2020 0348   MCH 33.6 01/05/2020 0348   MCHC 33.1 01/05/2020 0348   RDW 14.4 01/05/2020 0348   LYMPHSABS 1.1 01/05/2020 0348   MONOABS 0.5 01/05/2020 0348   EOSABS 0.1 01/05/2020 0348   BASOSABS 0.0 01/05/2020 0348    CMP     Component Value Date/Time   NA 142 01/05/2020 0348   K 4.7 01/05/2020 0348   CL 116 (H) 01/05/2020 0348   CO2 20 (L) 01/05/2020 0348   GLUCOSE 323 (H) 01/05/2020 0348   BUN 35 (H) 01/05/2020 0348   CREATININE 0.92 01/05/2020 0348   CALCIUM 7.5 (L) 01/05/2020 0348   PROT 5.3 (L) 01/05/2020 0348   ALBUMIN 1.3 (L) 01/05/2020 0348   AST 56 (H) 01/05/2020 0348   ALT 49 (H) 01/05/2020 0348   ALKPHOS 153 (H) 01/05/2020 0348   BILITOT 2.7 (H) 01/05/2020 0348   GFRNONAA >60  01/05/2020 0348   GFRAA >60 01/05/2020 0348    Imaging I have reviewed images in epic and the results pertinent to this consultation are: Septic facet arthritis and 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 Suspected bilateral septic facet arthritis at L4-5 with associated fluid collections/abscesses posterior to the facet joints. Epidural inflammation/phlegmon from L3-S1 without a discrete lumbar epidural abscess. Abnormal epidural enhancement in the lower and midthoracic spine also likely related to spinal infection without evidence of an epidural abscess. CSF signal intensity fluid collections extending from the upper lumbar spine to the midthoracic spine, possibly reactive subdural effusions or loculated subarachnoid collections related to meningitis.   Assessment: 69 year old man with Initial presentation to the outside hospital was for back pain after which he became more altered and his condition worsened. Family reports him being altered in his mentation for over  2 days- suspect some sort of a abscess or bacterial infection of the spine which probably spread to the brain causing meningitis/ventriculitis. He was then transferred from an outside hospital with multiple issues including MRSA bacteremia, new onset atrial fibrillation with RVR, deranged liver and kidney functions.   On 6/18: neurological consultation obtained as a code stroke for left-sided weakness. MRI brain did not reveal a stroke. However, this did show debris in the ventricle suggestive of ventriculitis/meningitis.  -Routine EEG showed generalized slowing.  -Spinal tap was attempted under fluoroscopy guidance but was only able to get 0.5 cc of yellow fluid-likely secondary to the CNS infection.  Impression: 1. Ventriculitis/meningitis- ID consulted and abx started 2. Toxic metabolic encephalopathy, coma-multifactorial including meningitis/ventriculitis as well as bacteremia 3. Septic arthritis and intraspinal infection- ID treating  with Abx. MRI Cspine added to ensure there is no abscess compressing the cord there. Currently, Tspine and Lspine lesions are not compressing lesions, although there is significant stenosis.  4. New A. fib with RVR- recommend resuming anticoagulation when able for stroke prevention 3.Transaminitis 4. Thrombocytopenia 5. AKI  Recommendations: Add MRI w/wo Cspine to look for further abscess  Follow neuro exam ID consulted Continue broad-spectrum antibiotic coverage. Correction of toxic metabolic derangements per primary team as you are. Brief curbside discussion with Dr. Megan Salon 12mn cc time  DEvans Army Community HospitalMetzger-Cihelka, ARNP-C, ANVP-BC Pager: [phone]  NEUROHOSPITALIST ADDENDUM Performed a face to face diagnostic evaluation.   I have reviewed the contents of history and physical exam as documented by PA/ARNP/Resident and agree with above documentation.  I have discussed and formulated the above plan as documented. Edits to the note have been made as needed.  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 Since then patient has been having worsening mental status requiring sedation-also not moving any extremities spontaneously on exam today (however under heavy sedation).  Reviewed MRI T and L-spine findings-shows evidence of intraspinal infection with no cord compressive lesions that would warrant a neurosurgery consultation.  Recommended MRI C-spine as this was also not done-does not show any compressive abscess.  Recommending wean sedation as much as possible and continue to treat MRSA infection with antibiotics.     SKarena AddisonAroor MD [facility] [phone]  If 7pm to 7am, please call on call as listed on AMION.

## 2020-01-05 NOTE — Progress Notes (Signed)
Inpatient Diabetes Program Recommendations  AACE/ADA: New Consensus Statement on Inpatient Glycemic Control (2015)  Target Ranges:  Prepandial:   less than 140 mg/dL      Peak postprandial:   less than 180 mg/dL (1-2 hours)      Critically ill patients:  140 - 180 mg/dL   Lab Results  Component Value Date   GLUCAP 252 (H) 01/05/2020   HGBA1C 8.5 (H) Jan 15, 2020    Review of Glycemic Control  Diabetes history: DM2 Outpatient Diabetes medications: Glucotrol 20 mg bid + Metformin 1700 mg q am Current orders for Inpatient glycemic control: IV insulin restarting per ICU protocol  Inpatient Diabetes Program Recommendations:   Reviewed CBGs and noted IV insulin restarting due to hyperlycemia. When patient ready to transition off of insulin drip, please consider: -Levermir 15 units bid (0.2 units/kg x 149 kg) -Tube feed coverage 4 units q hrs (hold if tube feed stopped or held for any reason) -Novolog correction scale q 4 hrs.  Thank you, Billy Fischer. Rakeem Colley, RN, MSN, CDE  Diabetes Coordinator Inpatient Glycemic Control Team Team Pager [phone] (8am-5pm) 01/05/2020 1:56 PM

## 2020-01-06 ENCOUNTER — Inpatient Hospital Stay (HOSPITAL_COMMUNITY): Payer: Medicare Other

## 2020-01-06 DIAGNOSIS — G0490 Encephalitis and encephalomyelitis, unspecified: Secondary | ICD-10-CM

## 2020-01-06 LAB — CBC WITH DIFFERENTIAL/PLATELET
Abs Immature Granulocytes: 0.15 10*3/uL — ABNORMAL HIGH (ref 0.00–0.07)
Basophils Absolute: 0 10*3/uL (ref 0.0–0.1)
Basophils Relative: 0 %
Eosinophils Absolute: 0.2 10*3/uL (ref 0.0–0.5)
Eosinophils Relative: 2 %
HCT: 27.7 % — ABNORMAL LOW (ref 39.0–52.0)
Hemoglobin: 9 g/dL — ABNORMAL LOW (ref 13.0–17.0)
Immature Granulocytes: 2 %
Lymphocytes Relative: 15 %
Lymphs Abs: 1.4 10*3/uL (ref 0.7–4.0)
MCH: 33.1 pg (ref 26.0–34.0)
MCHC: 32.5 g/dL (ref 30.0–36.0)
MCV: 101.8 fL — ABNORMAL HIGH (ref 80.0–100.0)
Monocytes Absolute: 0.5 10*3/uL (ref 0.1–1.0)
Monocytes Relative: 5 %
Neutro Abs: 7.5 10*3/uL (ref 1.7–7.7)
Neutrophils Relative %: 76 %
Platelets: 127 10*3/uL — ABNORMAL LOW (ref 150–400)
RBC: 2.72 MIL/uL — ABNORMAL LOW (ref 4.22–5.81)
RDW: 14.7 % (ref 11.5–15.5)
WBC: 9.8 10*3/uL (ref 4.0–10.5)
nRBC: 0 % (ref 0.0–0.2)

## 2020-01-06 LAB — CULTURE, RESPIRATORY W GRAM STAIN: Culture: NO GROWTH

## 2020-01-06 LAB — GLUCOSE, CAPILLARY
Glucose-Capillary: 279 mg/dL — ABNORMAL HIGH (ref 70–99)
Glucose-Capillary: 123 mg/dL — ABNORMAL HIGH (ref 70–99)
Glucose-Capillary: 191 mg/dL — ABNORMAL HIGH (ref 70–99)
Glucose-Capillary: 240 mg/dL — ABNORMAL HIGH (ref 70–99)
Glucose-Capillary: 210 mg/dL — ABNORMAL HIGH (ref 70–99)
Glucose-Capillary: 162 mg/dL — ABNORMAL HIGH (ref 70–99)
Glucose-Capillary: 61 mg/dL — ABNORMAL LOW (ref 70–99)
Glucose-Capillary: 82 mg/dL (ref 70–99)

## 2020-01-06 LAB — COMPREHENSIVE METABOLIC PANEL
ALT: 37 U/L (ref 0–44)
AST: 56 U/L — ABNORMAL HIGH (ref 15–41)
Albumin: 1.2 g/dL — ABNORMAL LOW (ref 3.5–5.0)
Alkaline Phosphatase: 142 U/L — ABNORMAL HIGH (ref 38–126)
Anion gap: 6 (ref 5–15)
BUN: 38 mg/dL — ABNORMAL HIGH (ref 8–23)
CO2: 18 mmol/L — ABNORMAL LOW (ref 22–32)
Calcium: 7.5 mg/dL — ABNORMAL LOW (ref 8.9–10.3)
Chloride: 120 mmol/L — ABNORMAL HIGH (ref 98–111)
Creatinine, Ser: 0.95 mg/dL (ref 0.61–1.24)
GFR calc Af Amer: >60 mL/min (ref >60)
GFR calc non Af Amer: >60 mL/min (ref >60)
Glucose, Bld: 215 mg/dL — ABNORMAL HIGH (ref 70–99)
Potassium: 5 mmol/L (ref 3.5–5.1)
Sodium: 144 mmol/L (ref 135–145)
Total Bilirubin: 4 mg/dL — ABNORMAL HIGH (ref 0.3–1.2)
Total Protein: 5.6 g/dL — ABNORMAL LOW (ref 6.5–8.1)

## 2020-01-06 LAB — TRIGLYCERIDES: Triglycerides: 142 mg/dL (ref <150)

## 2020-01-06 IMAGING — DX DG CHEST 1V PORT
1 series · 1 of 1 positions shown · non-contrast
Comparison: [DATE]

CLINICAL DATA: Hyperbilirubinemia.

EXAM:
PORTABLE CHEST 1 VIEW

[chest ap]
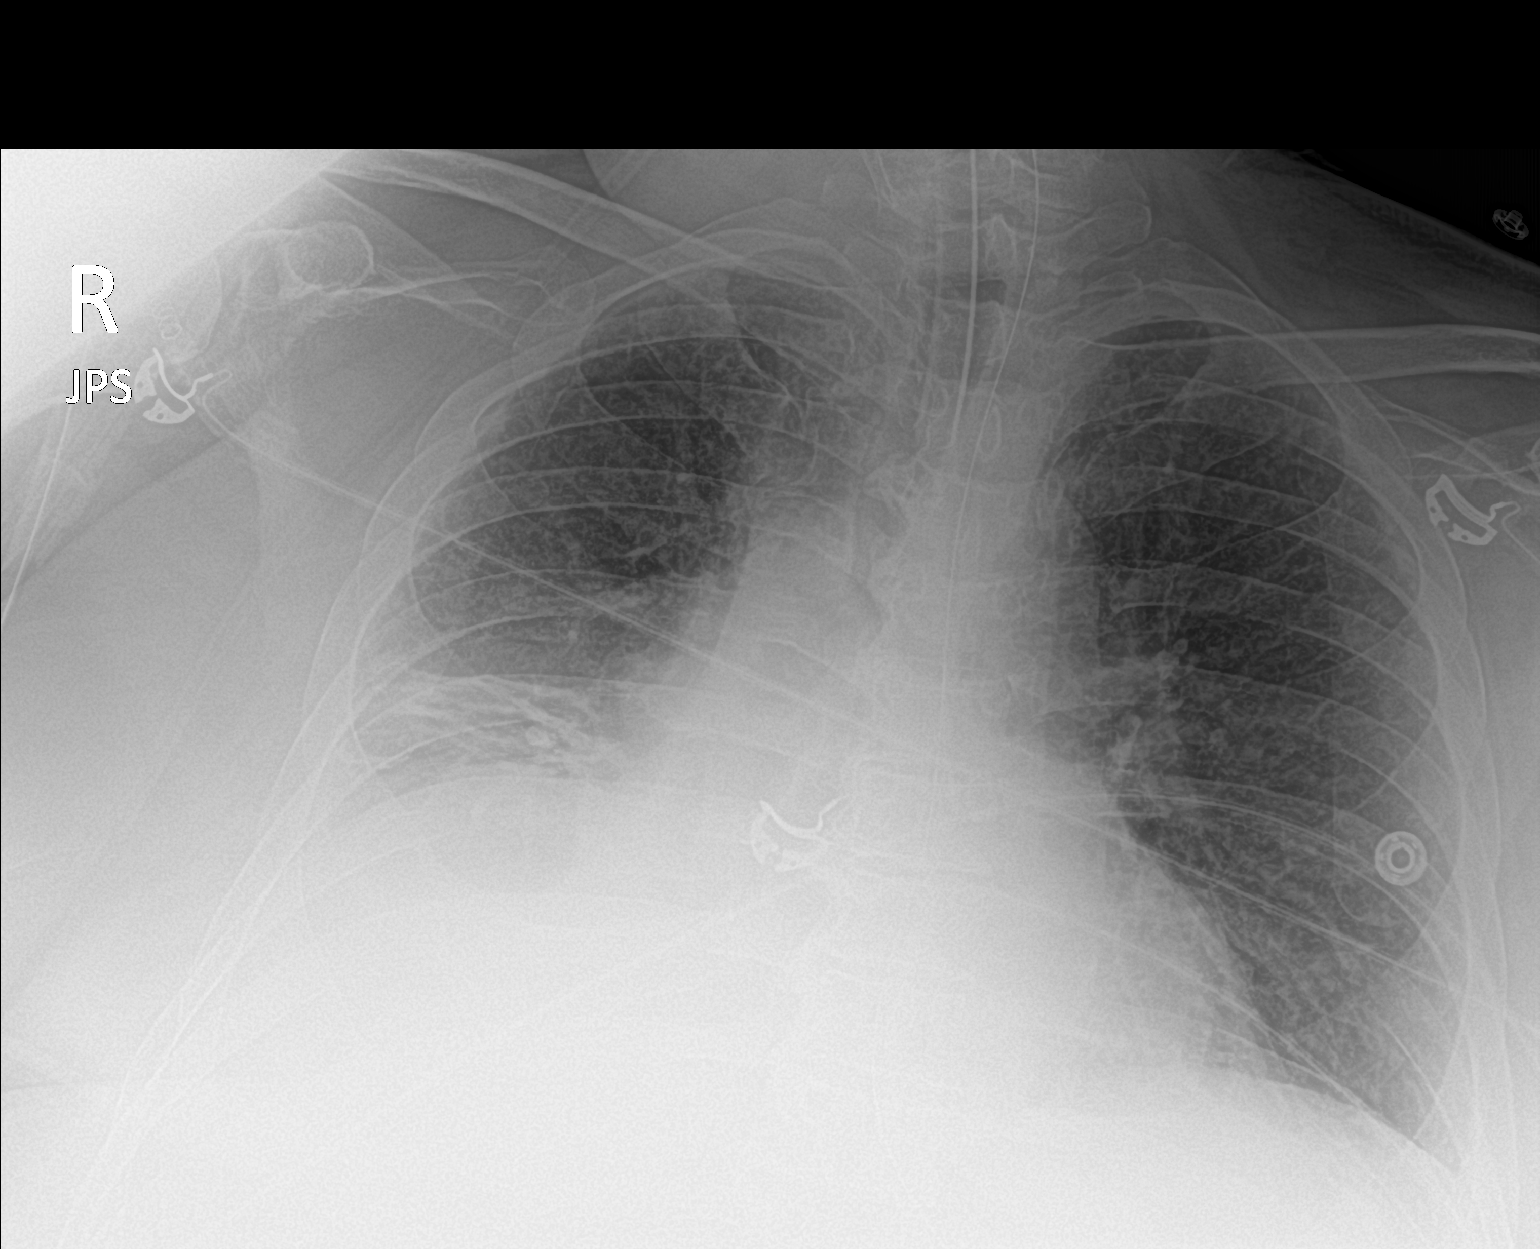

[1 of 1 positions shown; findings below may reference images not displayed]

FINDINGS: There is stable endotracheal tube positioning. Interval nasogastric
tube placement is seen with its distal tip extending into the body
of the stomach. Mild atelectasis and/or infiltrate is seen within
the right lung base. This represents a new finding when compared to
the prior study. There is no evidence of a pleural effusion or
pneumothorax. The cardiac silhouette is mildly enlarged and
unchanged in size. Multilevel degenerative changes seen throughout
the thoracic spine.
IMPRESSION: 1. Mild right basilar atelectasis and/or infiltrate.
2. Interval nasogastric tube placement positioning, as described
above.

## 2020-01-06 IMAGING — US US ABDOMEN LIMITED
1 series · 14 of 25 positions shown · non-contrast
Comparison: None.

CLINICAL DATA: Hyperbilirubinemia.

EXAM:
ULTRASOUND ABDOMEN LIMITED RIGHT UPPER QUADRANT

[Series 1: us abdomen limited ruq · 14 of 30 slices shown]
[im 1/30]
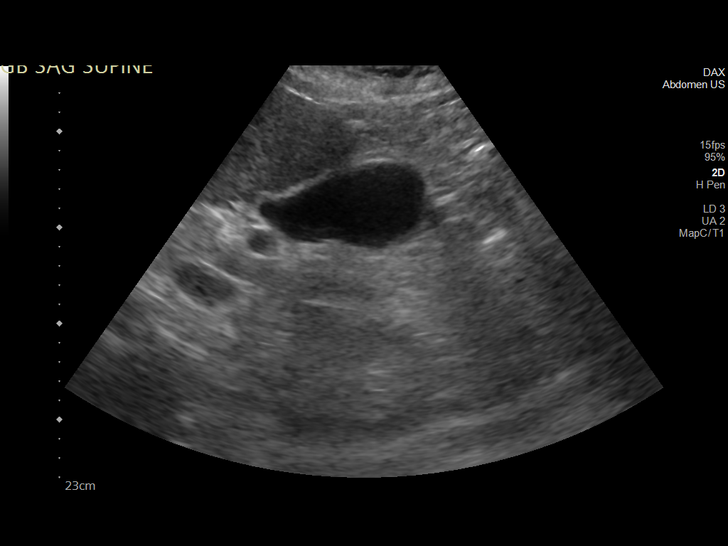
[im 3/30]
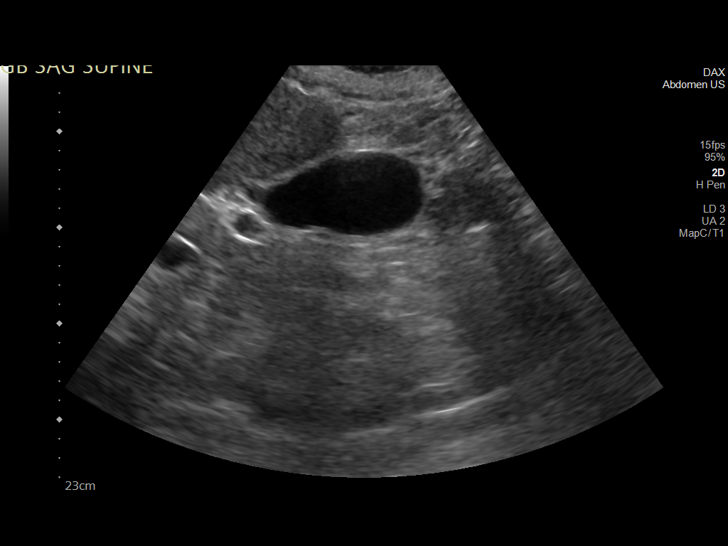
[im 5/30]
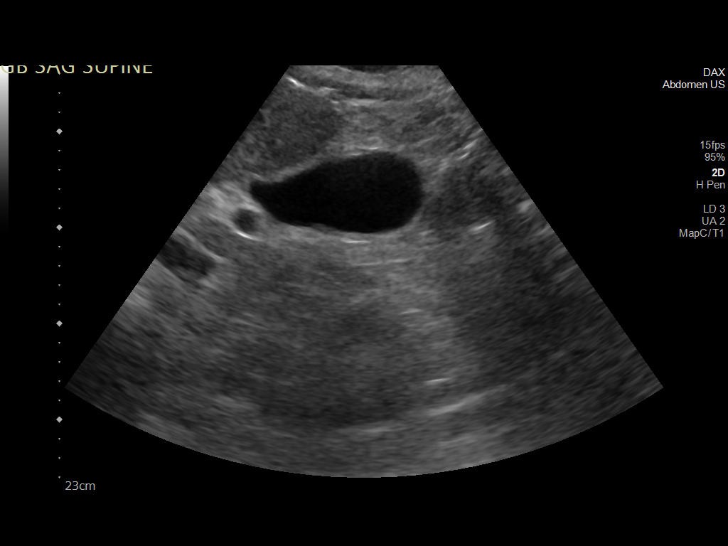
[im 8/30]
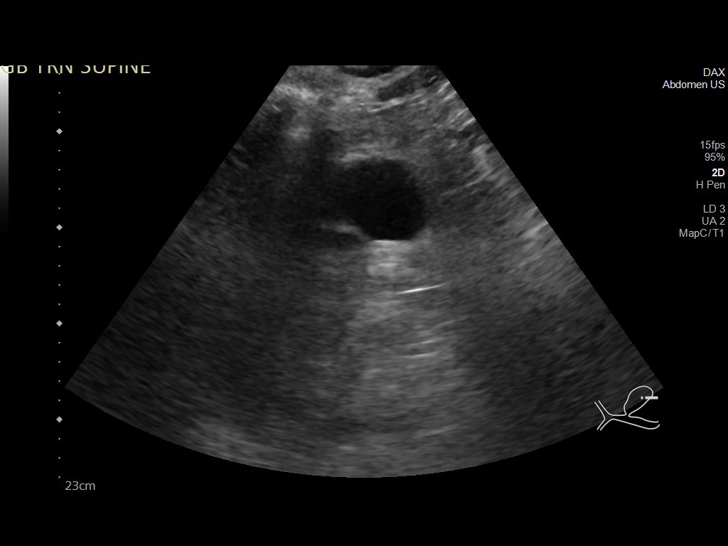
[im 10/30]
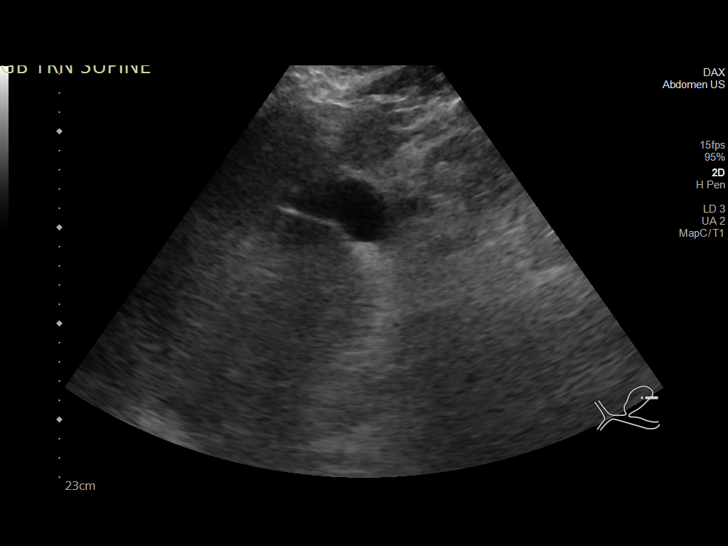
[im 11/30]
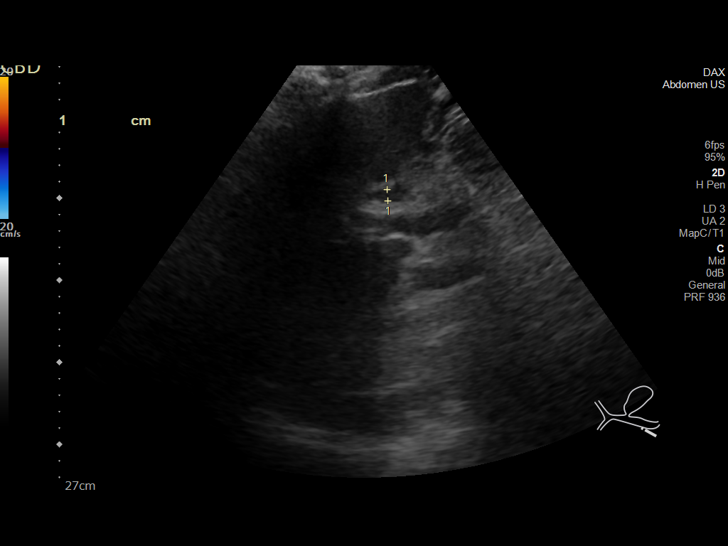
[im 14/30]
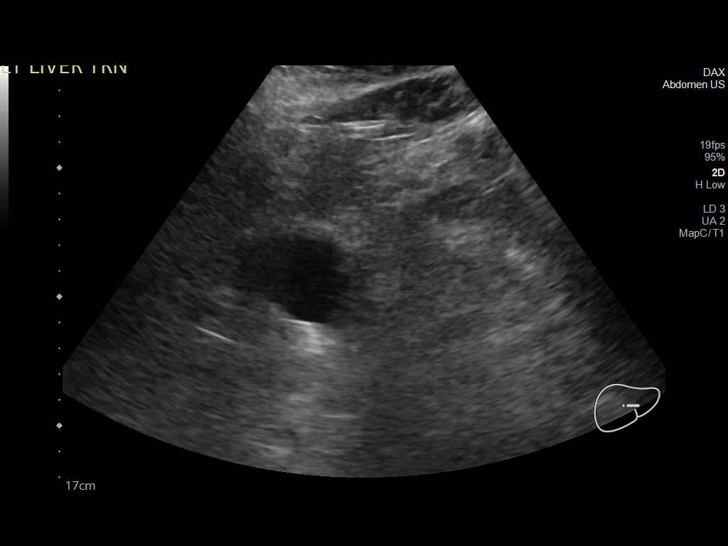
[im 16/30]
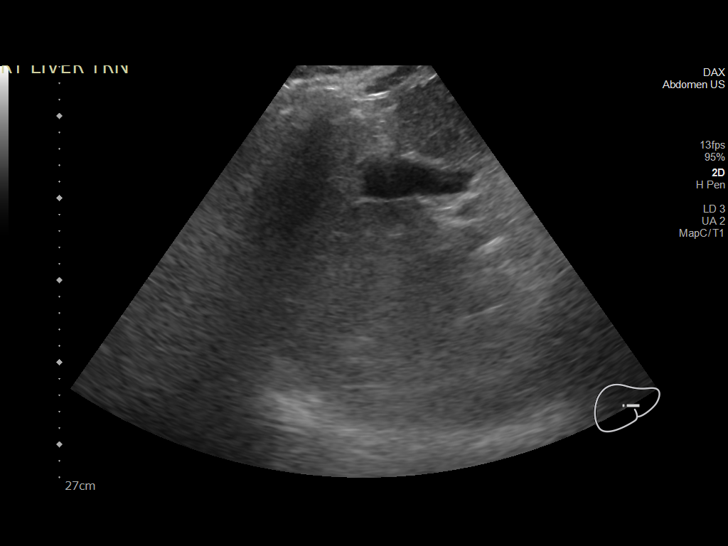
[im 19/30]
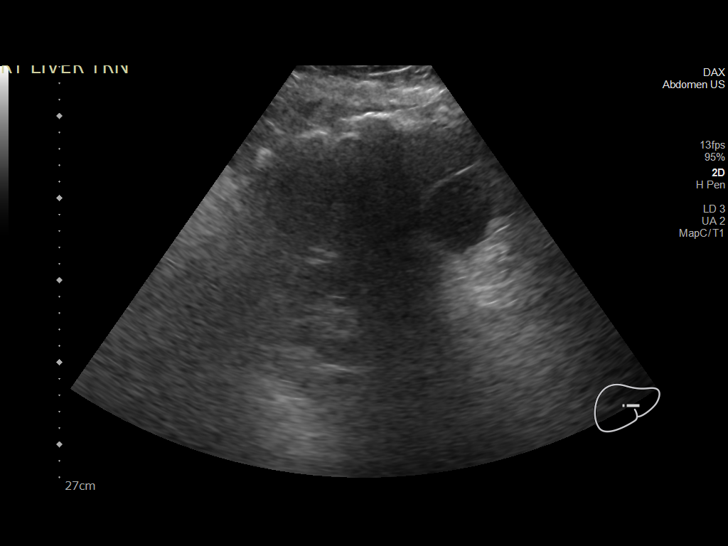
[im 20/30]
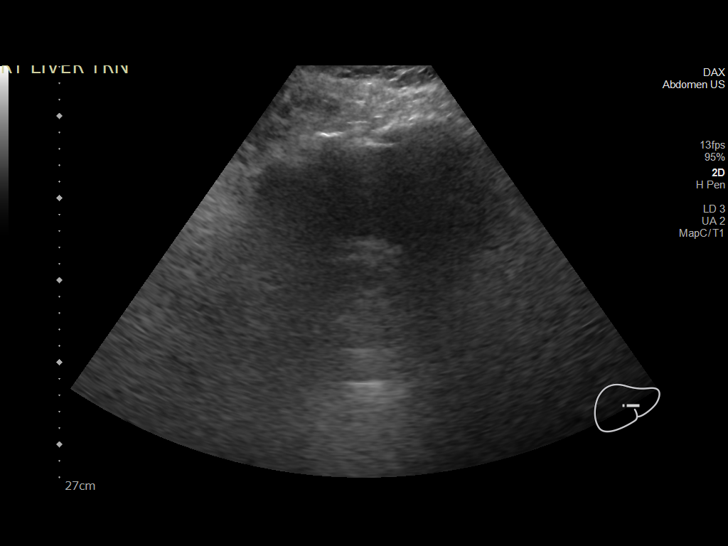
[im 22/30]
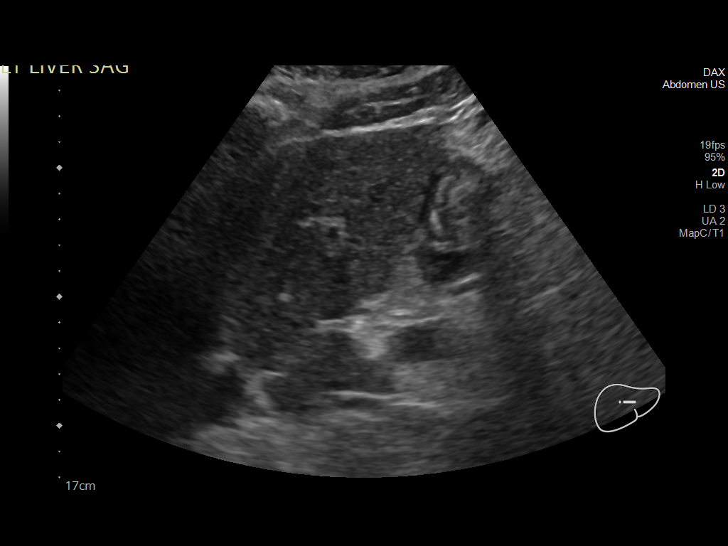
[im 25/30]
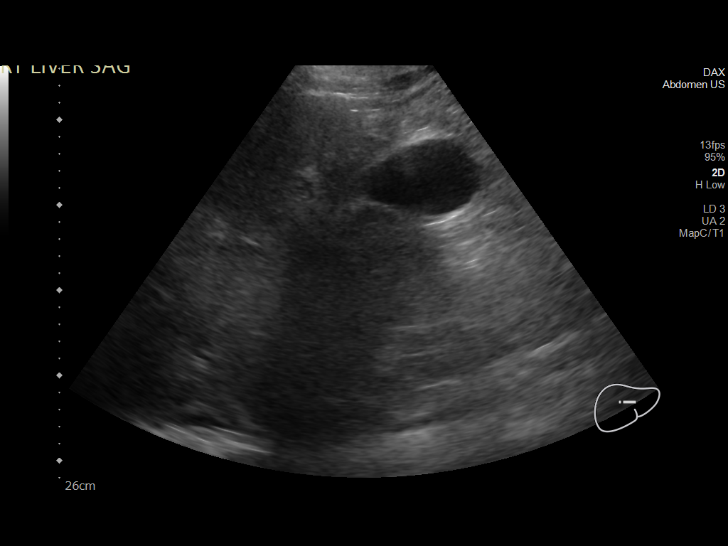
[im 27/30]
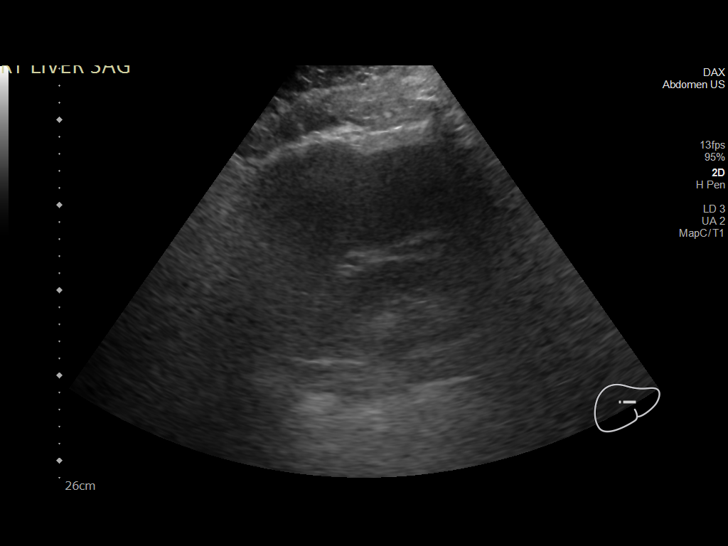
[im 30/30]
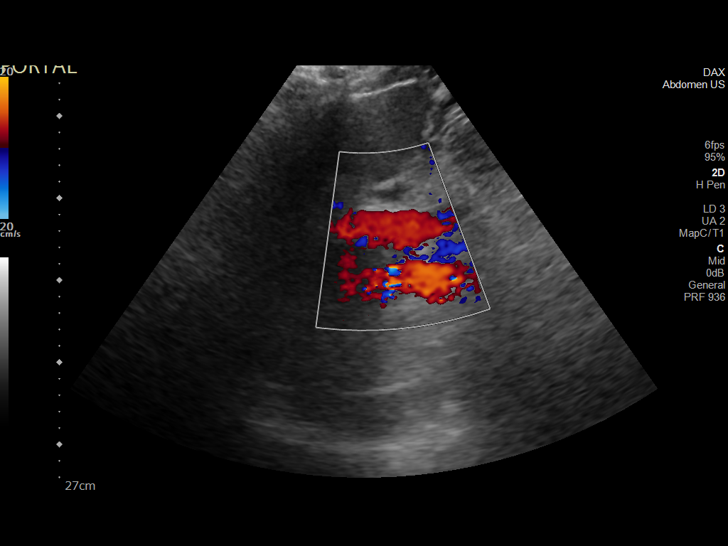

[14 of 25 positions shown; findings below may reference images not displayed]

FINDINGS: Gallbladder:

No gallstones or wall thickening visualized (3.8 mm). No sonographic
Murphy sign noted by sonographer.

Common bile duct:

Diameter: 6.9 mm

Liver:

No focal lesion identified. There is diffusely increased
echogenicity of the liver parenchyma. Portal vein is patent on color
Doppler imaging with normal direction of blood flow towards the
liver.

Other: None.
IMPRESSION: Fatty liver.

## 2020-01-06 MED ORDER — SODIUM ZIRCONIUM CYCLOSILICATE 5 G PO PACK
10.0000 g | PACK | Freq: Once | ORAL | Status: AC
  Administered 2020-01-06: 10 g
  Filled 2020-01-06: qty 2

## 2020-01-06 MED ORDER — DEXTROSE 50 % IV SOLN: 12.5000 g | INTRAVENOUS | Status: AC

## 2020-01-06 MED ORDER — SODIUM ZIRCONIUM CYCLOSILICATE 5 G PO PACK: 10.0000 g | PACK | Freq: Once | ORAL | Status: DC

## 2020-01-06 MED ORDER — INSULIN ASPART 100 UNIT/ML ~~LOC~~ SOLN
5.0000 [IU] | SUBCUTANEOUS | Status: DC
  Administered 2020-01-06 – 2020-01-07 (×5): 5 [IU] via SUBCUTANEOUS

## 2020-01-06 MED ORDER — DEXTROSE 50 % IV SOLN
INTRAVENOUS | Status: AC
  Administered 2020-01-06: 12.5 g via INTRAVENOUS
  Filled 2020-01-06: qty 50

## 2020-01-06 NOTE — Progress Notes (Signed)
Neurology Progress Note   S:// Family has arrived form out of town. They are very much still trying to wrap their headaround the situation. I answered their many questions.  O:// Current vital signs: BP 112/60   Pulse 80   Temp (!) 102.7 F (39.3 C) (Oral)   Resp (!) 26   Ht 5' 10"  (1.778 m)   Wt (!) 152 kg   SpO2 96%   BMI 48.08 kg/m  Vital signs in last 24 hours: Temp:  [99.7 F (37.6 C)-102.7 F (39.3 C)] 102.7 F (39.3 C) (06/22 0700) Pulse Rate:  [69-113] 80 (06/22 1100) Resp:  [23-36] 26 (06/22 1100) BP: (94-156)/(52-74) 112/60 (06/22 1100) SpO2:  [96 %-100 %] 96 % (06/22 1100) FiO2 (%):  [40 %] 40 % (06/22 1200) Weight:  [152 kg] 152 kg (06/22 0500) Neurological exam Sedated intubated Propofol was held for examination for 57mn. No spontaneous movements noted Pupils are equal round reactive light, 127mGaze is conjugate today, no tracking  Facial symmetry difficult to ascertain due to the endotracheal tube. Has bilateral corneal reflexes present He became tachypnic on vent wen sedation held. Good cough/gag No movement in any extremities to noxious stimuli, but some facial grimaces to central noxious stimulation   Medications  Current Facility-Administered Medications:  .  acetaminophen (TYLENOL) 160 MG/5ML solution 650 mg, 650 mg, Per Tube, Q4H PRN, AvJudd LienMD, 650 mg at 01/06/20 1223 .  amiodarone (NEXTERONE) 1.8 mg/mL load via infusion 150 mg, 150 mg, Intravenous, Once **FOLLOWED BY** [EXPIRED] amiodarone (NEXTERONE PREMIX) 360-4.14 MG/200ML-% (1.8 mg/mL) IV infusion, 60 mg/hr, Intravenous, Continuous, Last Rate: 33.3 mL/hr at 01/03/20 0100, 60 mg/hr at 01/03/20 0100 **FOLLOWED BY** amiodarone (NEXTERONE PREMIX) 360-4.14 MG/200ML-% (1.8 mg/mL) IV infusion, 30 mg/hr, Intravenous, Continuous, Bowser, Grace E, NP, Last Rate: 16.67 mL/hr at 01/06/20 1130, 30 mg/hr at 01/06/20 1130 .  aspirin chewable tablet 81 mg, 81 mg, Per Tube, Daily, Desai, Rahul P,  PA-C, 81 mg at 01/06/20 0827 .  atorvastatin (LIPITOR) tablet 10 mg, 10 mg, Per Tube, Daily, Desai, Rahul P, PA-C, 10 mg at 01/06/20 0831 .  chlorhexidine gluconate (MEDLINE KIT) (PERIDEX) 0.12 % solution 15 mL, 15 mL, Mouth Rinse, BID, Icard, Bradley L, DO, 15 mL at 01/06/20 0800 .  Chlorhexidine Gluconate Cloth 2 % PADS 6 each, 6 each, Topical, Daily, AgKipp BroodMD, 6 each at 01/06/20 0848 .  dexmedetomidine (PRECEDEX) 400 MCG/100ML (4 mcg/mL) infusion, 0.4-1.2 mcg/kg/hr, Intravenous, Titrated, ClJulian HyDO, Last Rate: 44.7 mL/hr at 01/06/20 1127, 1.2 mcg/kg/hr at 01/06/20 1127 .  dextrose 10 % infusion, , Intravenous, Continuous PRN, SoAnders SimmondsMD .  diltiazem (CARDIZEM) 125 mg in dextrose 5% 125 mL (1 mg/mL) infusion, 5-15 mg/hr, Intravenous, Continuous, Ogan, Okoronkwo U, MD, Last Rate: 10 mL/hr at 01/06/20 0932, 10 mg/hr at 01/06/20 0932 .  docusate (COLACE) 50 MG/5ML liquid 100 mg, 100 mg, Per Tube, BID, Icard, Bradley L, DO, 100 mg at 01/06/20 0927 .  docusate sodium (COLACE) capsule 100 mg, 100 mg, Oral, BID PRN, DaMerlene Laughter, NP .  feeding supplement (PRO-STAT SUGAR FREE 64) liquid 30 mL, 30 mL, Per Tube, QID, ClNoemi Chapel, DO, 30 mL at 01/06/20 0829 .  feeding supplement (VITAL HIGH PROTEIN) liquid 1,000 mL, 1,000 mL, Per Tube, Continuous, ClJulian HyDO, Last Rate: 55 mL/hr at 01/05/20 2004, 1,000 mL at 01/05/20 2004 .  fentaNYL (SUBLIMAZE) bolus via infusion 25 mcg, 25 mcg, Intravenous, Q15 min PRN, DaMerlene Laughter  F, NP, 25 mcg at 01/05/20 1153 .  fentaNYL (SUBLIMAZE) injection 25 mcg, 25 mcg, Intravenous, Once, Davis, Whitney F, NP .  fentaNYL 2547mg in NS 252m(1028mml) infusion-PREMIX, 25-200 mcg/hr, Intravenous, Continuous, DavMerlene Laughter NP, Last Rate: 17.5 mL/hr at 01/06/20 0930, 175 mcg/hr at 01/06/20 0930 .  heparin injection 5,000 Units, 5,000 Units, Subcutaneous, Q8H, Agarwala, RavEinar GradD, 5,000 Units at 01/06/20 0522 .  insulin aspart (novoLOG)  injection 3-9 Units, 3-9 Units, Subcutaneous, Q4H, SomAnders SimmondsD, 9 Units at 01/06/20 1209 .  insulin aspart (novoLOG) injection 5 Units, 5 Units, Subcutaneous, Q4H, Desai, Rahul P, PA-C, 5 Units at 01/06/20 1208 .  insulin detemir (LEVEMIR) injection 31 Units, 31 Units, Subcutaneous, Q12H, SomAnders SimmondsD, 31 Units at 01/06/20 [id number] MEDLINE mouth rinse, 15 mL, Mouth Rinse, 10 times per day, Icard, Bradley L, DO, 15 mL at 01/06/20 1200 .  pantoprazole (PROTONIX) injection 40 mg, 40 mg, Intravenous, QHS, DavMerlene Laughter NP, 40 mg at 01/05/20 2158 .  polyethylene glycol (MIRALAX / GLYCOLAX) packet 17 g, 17 g, Oral, Daily PRN, DavMerlene Laughter NP .  polyethylene glycol (MIRALAX / GLYCOLAX) packet 17 g, 17 g, Per Tube, Daily, Icard, Bradley L, DO, 17 g at 01/06/20 0828 .  propofol (DIPRIVAN) 1000 MG/100ML infusion, 0-50 mcg/kg/min, Intravenous, Continuous, DavMerlene Laughter NP, Last Rate: 17.04 mL/hr at 01/06/20 0908, 20 mcg/kg/min at 01/06/20 0908 .  rifampin (RIFADIN) 600 mg in sodium chloride 0.9 % 100 mL IVPB, 600 mg, Intravenous, Q24H, CamMichel BickersD, Last Rate: 200 mL/hr at 01/06/20 0929, 600 mg at 01/06/20 0929 .  sodium chloride flush (NS) 0.9 % injection 10-40 mL, 10-40 mL, Intracatheter, Q12H, Icard, Bradley L, DO, 10 mL at 01/05/20 2200 .  sodium chloride flush (NS) 0.9 % injection 10-40 mL, 10-40 mL, Intracatheter, PRN, Icard, Bradley L, DO .  vancomycin (VANCOREADY) IVPB 1250 mg/250 mL, 1,250 mg, Intravenous, Q12H, Carney, JesGay FillerPH, Last Rate: 166.7 mL/hr at 01/06/20 1058, 1,250 mg at 01/06/20 1058 Labs CBC    Component Value Date/Time   WBC 9.8 01/06/2020 0658   RBC 2.72 (L) 01/06/2020 0658   HGB 9.0 (L) 01/06/2020 0658   HCT 27.7 (L) 01/06/2020 0658   PLT 127 (L) 01/06/2020 0658   MCV 101.8 (H) 01/06/2020 0658   MCH 33.1 01/06/2020 0658   MCHC 32.5 01/06/2020 0658   RDW 14.7 01/06/2020 0658   LYMPHSABS 1.4 01/06/2020 0658   MONOABS 0.5 01/06/2020 0658    EOSABS 0.2 01/06/2020 0658   BASOSABS 0.0 01/06/2020 0658    CMP     Component Value Date/Time   NA 144 01/06/2020 0332   K 5.0 01/06/2020 0332   CL 120 (H) 01/06/2020 0332   CO2 18 (L) 01/06/2020 0332   GLUCOSE 215 (H) 01/06/2020 0332   BUN 38 (H) 01/06/2020 0332   CREATININE 0.95 01/06/2020 0332   CALCIUM 7.5 (L) 01/06/2020 0332   PROT 5.6 (L) 01/06/2020 0332   ALBUMIN 1.2 (L) 01/06/2020 0332   AST 56 (H) 01/06/2020 0332   ALT 37 01/06/2020 0332   ALKPHOS 142 (H) 01/06/2020 0332   BILITOT 4.0 (H) 01/06/2020 0332   GFRNONAA >60 01/06/2020 0332   GFRAA >60 01/06/2020 0332    Imaging I have reviewed images in epic and the results pertinent to this consultation are: Septic facet arthritis and 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 Suspected bilateral septic facet arthritis at L4-5 with associated fluid collections/abscesses posterior to the facet joints. Epidural  inflammation/phlegmon from L3-S1 without a discrete lumbar epidural abscess. Abnormal epidural enhancement in the lower and midthoracic spine also likely related to spinal infection without evidence of an epidural abscess. CSF signal intensity fluid collections extending from the upper lumbar spine to the midthoracic spine, possibly reactive subdural effusions or loculated subarachnoid collections related to meningitis. Cspine also added to complete wk up, no abscess or cord compression noted.    Assessment: 69 year old man with Initial presentation to the outside hospital was for back pain after which he became more altered and his condition worsened. Family reports him being altered in his mentation for over 2 days- suspect some sort of a abscess or bacterial infection of the spine which probably spread to the brain causing meningitis/ventriculitis. He was then transferred from an outside hospital with multiple issues including MRSA bacteremia, new onset atrial fibrillation with RVR, deranged liver and kidney functions.   On  6/18: neurological consultation obtained as a code stroke for left-sided weakness. MRI brain did not reveal a stroke. However, this did show debris in the ventricle suggestive of ventriculitis/meningitis.  -Routine EEG showed generalized slowing.  -Spinal tap was attempted under fluoroscopy guidance but was only able to get 0.5 cc of yellow fluid-likely secondary to the CNS infection.  Impression: 1. Ventriculitis/meningitis- ID consulted and abx started 2. Toxic metabolic encephalopathy, coma-multifactorial including meningitis/ventriculitis as well as bacteremia 3. Septic arthritis and intraspinal infection- ID treating with Abx. MRI Cspine added to ensure there is no abscess compressing the cord there. Currently, Tspine and Lspine lesions are not compressing lesions, although there is significant stenosis.  4. New A. fib with RVR- recommend resuming anticoagulation when able for stroke prevention 3.Transaminitis 4. Thrombocytopenia 5. AKI  Recommendations: Follow neuro exam ID consulted Continue broad-spectrum antibiotic coverage. Correction of toxic metabolic derangements per primary team as you are. Seen and d/w CCM at bedside.  6mn cc time  Kimila Papaleo Metzger-Cihelka, ARNP-C, ANVP-BC Pager: [phone]  If 7pm to 7am, please call on call as listed on AMION.

## 2020-01-06 NOTE — Progress Notes (Signed)
Patient ID: Isaac Hamilton, male   DOB: 1951/01/26, 69 y.o.   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  PLAN: 1. 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)   Scheduled Meds: . amiodarone  150 mg Intravenous Once  . aspirin  81 mg Per Tube Daily  . atorvastatin  10 mg Per Tube Daily  . chlorhexidine gluconate (MEDLINE KIT)  15 mL Mouth Rinse BID  . Chlorhexidine Gluconate Cloth  6 each Topical Daily  . docusate  100 mg Per Tube BID  . feeding supplement (PRO-STAT SUGAR FREE 64)  30 mL Per Tube QID  . fentaNYL (SUBLIMAZE) injection  25 mcg Intravenous Once  . heparin injection (subcutaneous)  5,000 Units Subcutaneous Q8H  . insulin aspart  3-9 Units Subcutaneous Q4H  . insulin aspart  5 Units Subcutaneous Q4H  . insulin detemir  31 Units Subcutaneous Q12H  . mouth rinse  15 mL Mouth Rinse 10 times per day  . pantoprazole (PROTONIX) IV  40 mg Intravenous QHS  . polyethylene glycol  17 g Per Tube Daily  . sodium chloride flush  10-40 mL Intracatheter Q12H   Continuous Infusions: . amiodarone 30 mg/hr (01/06/20 0600)  . dexmedetomidine (PRECEDEX) IV infusion 1.2 mcg/kg/hr (01/06/20 0931)  . dextrose    . diltiazem (CARDIZEM) infusion 10 mg/hr (01/06/20 0932)  . feeding supplement (VITAL HIGH PROTEIN) 1,000 mL (01/05/20 2004)  . fentaNYL infusion INTRAVENOUS 175 mcg/hr (01/06/20 0930)  . propofol (DIPRIVAN) infusion 20 mcg/kg/min (01/06/20 0908)  . rifampin (RIFADIN)  IVPB 600 mg (01/06/20 0929)  . vancomycin 1,250 mg (01/06/20 1058)   PRN Meds:.acetaminophen (TYLENOL) oral liquid 160 mg/5 mL, dextrose, docusate sodium, fentaNYL, polyethylene glycol, sodium chloride flush  Review of Systems: Review of Systems  Unable to perform ROS: Intubated    No Known Allergies  OBJECTIVE: Vitals:   01/06/20 0400 01/06/20 0500 01/06/20 0600 01/06/20 0700  BP: (!) 111/56 (!) 112/57 (!) 110/54   Pulse: 77 77 78   Resp: (!) 27 (!) 27 (!) 28   Temp:    (!) 102.7 F (39.3 C)  TempSrc:    Oral  SpO2: 96% 97% 98%   Weight:  (!) 152 kg    Height:       Body mass index is 48.08 kg/m.  Physical Exam Constitutional:      Comments: He remains unresponsive on the ventilator.  Cardiovascular:     Comments: Very distant heart sounds. Pulmonary:     Breath sounds: Normal breath sounds.     Lab Results Lab Results  Component Value Date   WBC 9.8 01/06/2020   HGB 9.0 (L) 01/06/2020   HCT 27.7 (L) 01/06/2020   MCV 101.8 (H) 01/06/2020   PLT 127 (L) 01/06/2020    Lab Results  Component Value Date   CREATININE 0.95 01/06/2020   BUN 38 (H) 01/06/2020   NA 144 01/06/2020   K 5.0 01/06/2020  CL 120 (H) 01/06/2020   CO2 18 (L) 01/06/2020    Lab Results  Component Value Date   ALT 37 01/06/2020   AST 56 (H) 01/06/2020   ALKPHOS 142 (H) 01/06/2020   BILITOT 4.0 (H) 01/06/2020     Microbiology: Recent Results (from the past 240 hour(s))  Urine culture     Status: None   Collection Time: 12/17/2019  4:10 PM   Specimen: Urine, Random  Result Value Ref Range Status   Specimen Description URINE, RANDOM  Final   Special Requests NONE  Final   Culture   Final    NO GROWTH Performed at [facility], [place] [address] [place], [place] [address]    Report Status 01/04/2020 FINAL  Final  Culture, blood (routine x 2)     Status: None (Preliminary result)   Collection Time: 12/25/2019  6:08 PM   Specimen: BLOOD RIGHT HAND  Result Value Ref Range Status     Specimen Description BLOOD RIGHT HAND  Final   Special Requests   Final    BOTTLES DRAWN AEROBIC AND ANAEROBIC Blood Culture adequate volume   Culture   Final    NO GROWTH 4 DAYS Performed at [facility], [place] [address] [place], [place] [address]    Report Status PENDING  Incomplete  Culture, blood (routine x 2)     Status: None (Preliminary result)   Collection Time: 01/03/2020  6:20 PM   Specimen: BLOOD LEFT HAND  Result Value Ref Range Status   Specimen Description BLOOD LEFT HAND  Final   Special Requests   Final    BOTTLES DRAWN AEROBIC ONLY Blood Culture results may not be optimal due to an inadequate volume of blood received in culture bottles   Culture   Final    NO GROWTH 4 DAYS Performed at [facility], [place] [address] [place], [place] [address]    Report Status PENDING  Incomplete  CSF culture     Status: None   Collection Time: 01/03/20  4:26 PM   Specimen: PATH Cytology CSF; Cerebrospinal Fluid  Result Value Ref Range Status   Specimen Description CSF  Final   Special Requests NONE  Final   Gram Stain   Final    RARE WBC PRESENT, PREDOMINANTLY PMN RARE GRAM POSITIVE COCCI CRITICAL RESULT CALLED TO, READ BACK BY AND VERIFIED WITH: RN Felicity Pellegrini [id number] MLM Performed at [facility], [address], [place], [place] [address]    Culture FEW METHICILLIN RESISTANT STAPHYLOCOCCUS AUREUS  Final   Report Status 01/05/2020 FINAL  Final   Organism ID, Bacteria METHICILLIN RESISTANT STAPHYLOCOCCUS AUREUS  Final      Susceptibility   Methicillin resistant staphylococcus aureus - MIC*    CIPROFLOXACIN >=8 RESISTANT Resistant     ERYTHROMYCIN >=8 RESISTANT Resistant     GENTAMICIN <=0.5 SENSITIVE Sensitive     OXACILLIN >=4 RESISTANT Resistant     TETRACYCLINE <=1 SENSITIVE Sensitive     VANCOMYCIN 1 SENSITIVE Sensitive     TRIMETH/SULFA <=10 SENSITIVE Sensitive     CLINDAMYCIN RESISTANT Resistant     RIFAMPIN <=0.5 SENSITIVE Sensitive      Inducible Clindamycin POSITIVE Resistant     * FEW METHICILLIN RESISTANT STAPHYLOCOCCUS AUREUS  Culture, respiratory (tracheal aspirate)     Status: None   Collection Time: 01/03/20  8:52 PM   Specimen: Tracheal Aspirate; Respiratory  Result Value Ref Range Status   Specimen Description TRACHEAL ASPIRATE  Final   Special Requests NONE  Final   Gram Stain   Final    RARE WBC PRESENT, PREDOMINANTLY PMN NO ORGANISMS SEEN    Culture   Final    NO GROWTH Performed at [facility], [place] [address] [place], [place] [address]    Report Status 01/06/2020 FINAL  Final  Culture, blood (Routine X 2) w Reflex to ID Panel     Status: None (Preliminary result)   Collection Time: 01/04/20  7:34 AM   Specimen: BLOOD RIGHT HAND  Result Value Ref Range Status   Specimen Description BLOOD RIGHT HAND  Final   Special Requests   Final    BOTTLES DRAWN AEROBIC ONLY Blood Culture adequate volume   Culture   Final    NO GROWTH 2 DAYS Performed at [facility], [place] [address], [place], [place] [address]    Report Status PENDING  Incomplete  Culture, blood (Routine X 2) w Reflex to ID Panel     Status: None (Preliminary result)   Collection Time: 01/04/20  7:34 AM   Specimen: BLOOD LEFT HAND  Result Value Ref Range Status   Specimen Description BLOOD LEFT HAND  Final   Special Requests   Final    BOTTLES DRAWN AEROBIC ONLY Blood Culture adequate volume   Culture   Final    NO GROWTH 2 DAYS Performed at [facility], [place] [address] [place],  [address]    Report Status PENDING  Incomplete    Michel Bickers, MD [facility] [place] [facility] [phone] pager   [phone] cell 01/06/2020, 11:06 AM

## 2020-01-06 NOTE — Procedures (Signed)
Bronchoscopy Procedure Note Isaac Hamilton [id number] 1951-04-12  Procedure: Bronchoscopy Indications: Diagnostic evaluation of the airways, Obtain specimens for culture and/or other diagnostic studies and Remove secretions  Procedure Details Consent: Risks of procedure as well as the alternatives and risks of each were explained to the (patient/caregiver).  Consent for procedure obtained. Time Out: Verified patient identification, verified procedure, site/side was marked, verified correct patient position, special equipment/implants available, medications/allergies/relevent history reviewed, required imaging and test results available.  Performed  In preparation for procedure, patient was given 100% FiO2 and bronchoscope lubricated. Sedation: Benzodiazepines  Airway entered and the following bronchi were examined: RML and RLL.   Procedures performed: Brushings performed Bronchoscope removed.  , Patient placed back on 100% FiO2 at conclusion of procedure.    Evaluation Hemodynamic Status: BP stable throughout; O2 sats: stable throughout Patient's Current Condition: stable Specimens:  Sent serosanguinous fluid Complications: No apparent complications Patient did tolerate procedure well.   Isaac Hamilton 01/06/2020

## 2020-01-06 NOTE — Progress Notes (Signed)
NAME:  Isaac Hamilton, MRN:  [id number], DOB:  10/06/1950, LOS: 4 ADMISSION DATE:  12/23/2019, CONSULTATION DATE:  12/26/2019 REFERRING MD:  Lorin Mercy - TRH, CHIEF COMPLAINT:  Low back pain, fatigue , AMS + MRSA Bacteremia  Brief History   69 yo M transferred to [facility] to [facility] service 6/18. Upon arrival, unresponsive. PCCM consulted for evaluation. Neurology consulted as code stroke   History of present illness   69 yo M PMH DM, Afib, hx MRSA who transferred to [facility] from [place] with reported MRSA septicemia and inability to obtain MRI due to imaging; admitted to [facility] service, reportedly AOOx4 prior to transfer. Admitted to [facility] for possible back strain in setting of helping sig other move a scooter. Found to have MRSA bacteremia, and worsening malaise, fatigue. Hospitalization at [facility] complicated by AKI, thrombocytopenia, transaminitis, toxic encephalopathy. TEE not pursued due to thrombocytopenia.  Arrives to [facility] on dilt gtt, and unresponsive with L sided weakness. Concern that patient is not protecting airway; PCCM consulted and neurology paged for "code stroke."   PCCM and neurology at bedside with rapid response and RRT.    Past Medical History  DM2 Afib Chronic back pain HLD OA HTN Morbid obesity   Significant Hospital Events   6/18 arrives to [facility], unresponsive.   Consults:  Neurology ID PCCM  Procedures:  6/18 ETT >   Significant Diagnostic Tests:  6/18 CXR >>  6/19 CSF consistent with ventriculities 6/20 MRI spine > suspected bilateral septic facet arthritis with associated fluid collections / abscesses posterior to facet joints.  Phlegmon at L3-S1 without discrete abscess.  No definite evidence of discitis.  Mod to severe spinal stenosis and foraminal stenosis. 6/20 MRI cspine > no abscess or discitis.  Mild marrow edema.  Micro Data:  CSF 6/19 > MRSA Sputum 6/19 > neg  Antimicrobials:  Vanc 6/18 > Rifampin 6/21 >   Interim history/subjective:  Agitation overnight  and increased peak pressures with over breathing vent.  Required extra sedation, currently on propofol, precedex, fentanyl infusions.  Objective   Blood pressure (!) 110/54, pulse 78, temperature 99.7 F (37.6 C), temperature source Axillary, resp. rate (!) 28, height 5\' 10"  (1.778 m), weight (!) 152 kg, SpO2 98 %.    Vent Mode: PRVC FiO2 (%):  [40 %] 40 % Set Rate:  [15 bmp] 15 bmp Vt Set:  [580 mL] 580 mL PEEP:  [5 cmH20] 5 cmH20 Pressure Support:  [5 cmH20] 5 cmH20 Plateau Pressure:  [16 cmH20-20 cmH20] 20 cmH20   Intake/Output Summary (Last 24 hours) at 01/06/2020 0740 Last data filed at 01/06/2020 0600 Gross per 24 hour  Intake 4276.81 ml  Output 1178 ml  Net 3098.81 ml   Filed Weights   01/04/20 0326 01/05/20 0354 01/06/20 0500  Weight: (!) 151 kg (!) 149 kg (!) 152 kg    Examination: General: Adult male, critically ill, resting in bed, in NAD. Neuro: Sedated, not following commands. HEENT: [place]/AT. Sclerae anicteric. ETT in place. Cardiovascular: RRR, no M/R/G.  Lungs: Respirations even and unlabored.  CTA bilaterally, No W/R/R. Abdomen: Obese.  BS x 4, soft, NT/ND.  Musculoskeletal: No gross deformities, no edema.  Skin: Intact, warm, no rashes.   Assessment & Plan:   Critically ill due to acute respiratory failure in setting of encephalopathy and poor airway protection, requiring intubation, requiring mechanical ventilation.  - Re-attempt to wean sedation down and hopefully stop propofol. - Once sedation is weaned, re-try SBT. - If passes SBT from respiratory standpoint, mental  status will still likely be main barrier to extubation.  - Continue bronchial hygiene.  Acute Encephalopathy likely toxic metabolic encephalopathy in setting of severe sepsis due to MRSA bacteremia and meningitis. Septic spinal facet arthritis with phlegmon but no definitive abscess or discitis. - Continue vancomycin and rifampin per ID. - Dr. Chestine Spore discussed with ID, no role for TEE right  now. - PICC once blood cultures are negative.  Critically ill due to atrial fibrillation with rapid ventricular response, requiring amiodarone and diltiazem infusions for rate control - now in sinus tach. - Continue amio and dilt for now.  Attempt to transition to PO over next day or two. - Hold off on heparin / anticoagulation given sinus rhythm now. - Maintain potassium greater than 4 mmol/L.  Hx DM. - Continue SSI + levemir. - Hold home glipizide, metformin.  Best practice:  Diet: Continue tube feeding Pain/Anxiety/Delirium protocol (if indicated): Continue propofol, fentanyl, precedex.  Daily wake up assessment. VAP protocol (if indicated): Bundle in place. DVT prophylaxis: SCDs/Heparin GI prophylaxis: protonix  Glucose control: SSI  Mobility: BR Code Status: Full  Family Communication: Sons updated at bedside 6/22. Disposition: ICU    CC time: 35 min.   Rutherford Guys, [place] [facility] 01/06/2020, 7:40 AM

## 2020-01-07 ENCOUNTER — Inpatient Hospital Stay (HOSPITAL_COMMUNITY): Payer: Medicare Other

## 2020-01-07 DIAGNOSIS — J95851 Ventilator associated pneumonia: Secondary | ICD-10-CM

## 2020-01-07 LAB — CULTURE, BLOOD (ROUTINE X 2)
Culture: NO GROWTH
Culture: NO GROWTH
Special Requests: ADEQUATE

## 2020-01-07 LAB — CBC
HCT: 29.4 % — ABNORMAL LOW (ref 39.0–52.0)
Hemoglobin: 9.5 g/dL — ABNORMAL LOW (ref 13.0–17.0)
MCH: 34.1 pg — ABNORMAL HIGH (ref 26.0–34.0)
MCHC: 32.3 g/dL (ref 30.0–36.0)
MCV: 105.4 fL — ABNORMAL HIGH (ref 80.0–100.0)
Platelets: 139 10*3/uL — ABNORMAL LOW (ref 150–400)
RBC: 2.79 MIL/uL — ABNORMAL LOW (ref 4.22–5.81)
RDW: 15.2 % (ref 11.5–15.5)
WBC: 10.5 10*3/uL (ref 4.0–10.5)
nRBC: 0 % (ref 0.0–0.2)

## 2020-01-07 LAB — GLUCOSE, CAPILLARY
Glucose-Capillary: 122 mg/dL — ABNORMAL HIGH (ref 70–99)
Glucose-Capillary: 137 mg/dL — ABNORMAL HIGH (ref 70–99)
Glucose-Capillary: 140 mg/dL — ABNORMAL HIGH (ref 70–99)
Glucose-Capillary: 180 mg/dL — ABNORMAL HIGH (ref 70–99)
Glucose-Capillary: 181 mg/dL — ABNORMAL HIGH (ref 70–99)
Glucose-Capillary: 197 mg/dL — ABNORMAL HIGH (ref 70–99)
Glucose-Capillary: 78 mg/dL (ref 70–99)

## 2020-01-07 LAB — BASIC METABOLIC PANEL
Anion gap: 8 (ref 5–15)
BUN: 45 mg/dL — ABNORMAL HIGH (ref 8–23)
CO2: 18 mmol/L — ABNORMAL LOW (ref 22–32)
Calcium: 7.8 mg/dL — ABNORMAL LOW (ref 8.9–10.3)
Chloride: 121 mmol/L — ABNORMAL HIGH (ref 98–111)
Creatinine, Ser: 1.03 mg/dL (ref 0.61–1.24)
GFR calc Af Amer: >60 mL/min (ref >60)
GFR calc non Af Amer: >60 mL/min (ref >60)
Glucose, Bld: 136 mg/dL — ABNORMAL HIGH (ref 70–99)
Potassium: 4.7 mmol/L (ref 3.5–5.1)
Sodium: 147 mmol/L — ABNORMAL HIGH (ref 135–145)

## 2020-01-07 LAB — PHOSPHORUS: Phosphorus: 3.6 mg/dL (ref 2.5–4.6)

## 2020-01-07 LAB — MAGNESIUM: Magnesium: 1.8 mg/dL (ref 1.7–2.4)

## 2020-01-07 LAB — VANCOMYCIN, TROUGH: Vancomycin Tr: 20 ug/mL (ref 15–20)

## 2020-01-07 LAB — TRIGLYCERIDES: Triglycerides: 161 mg/dL — ABNORMAL HIGH (ref <150)

## 2020-01-07 IMAGING — DX DG CHEST 1V PORT
1 series · 1 of 1 positions shown · non-contrast
Comparison: None.

CLINICAL DATA: Respiratory failure.

EXAM:
PORTABLE CHEST 1 VIEW

[chest ap]
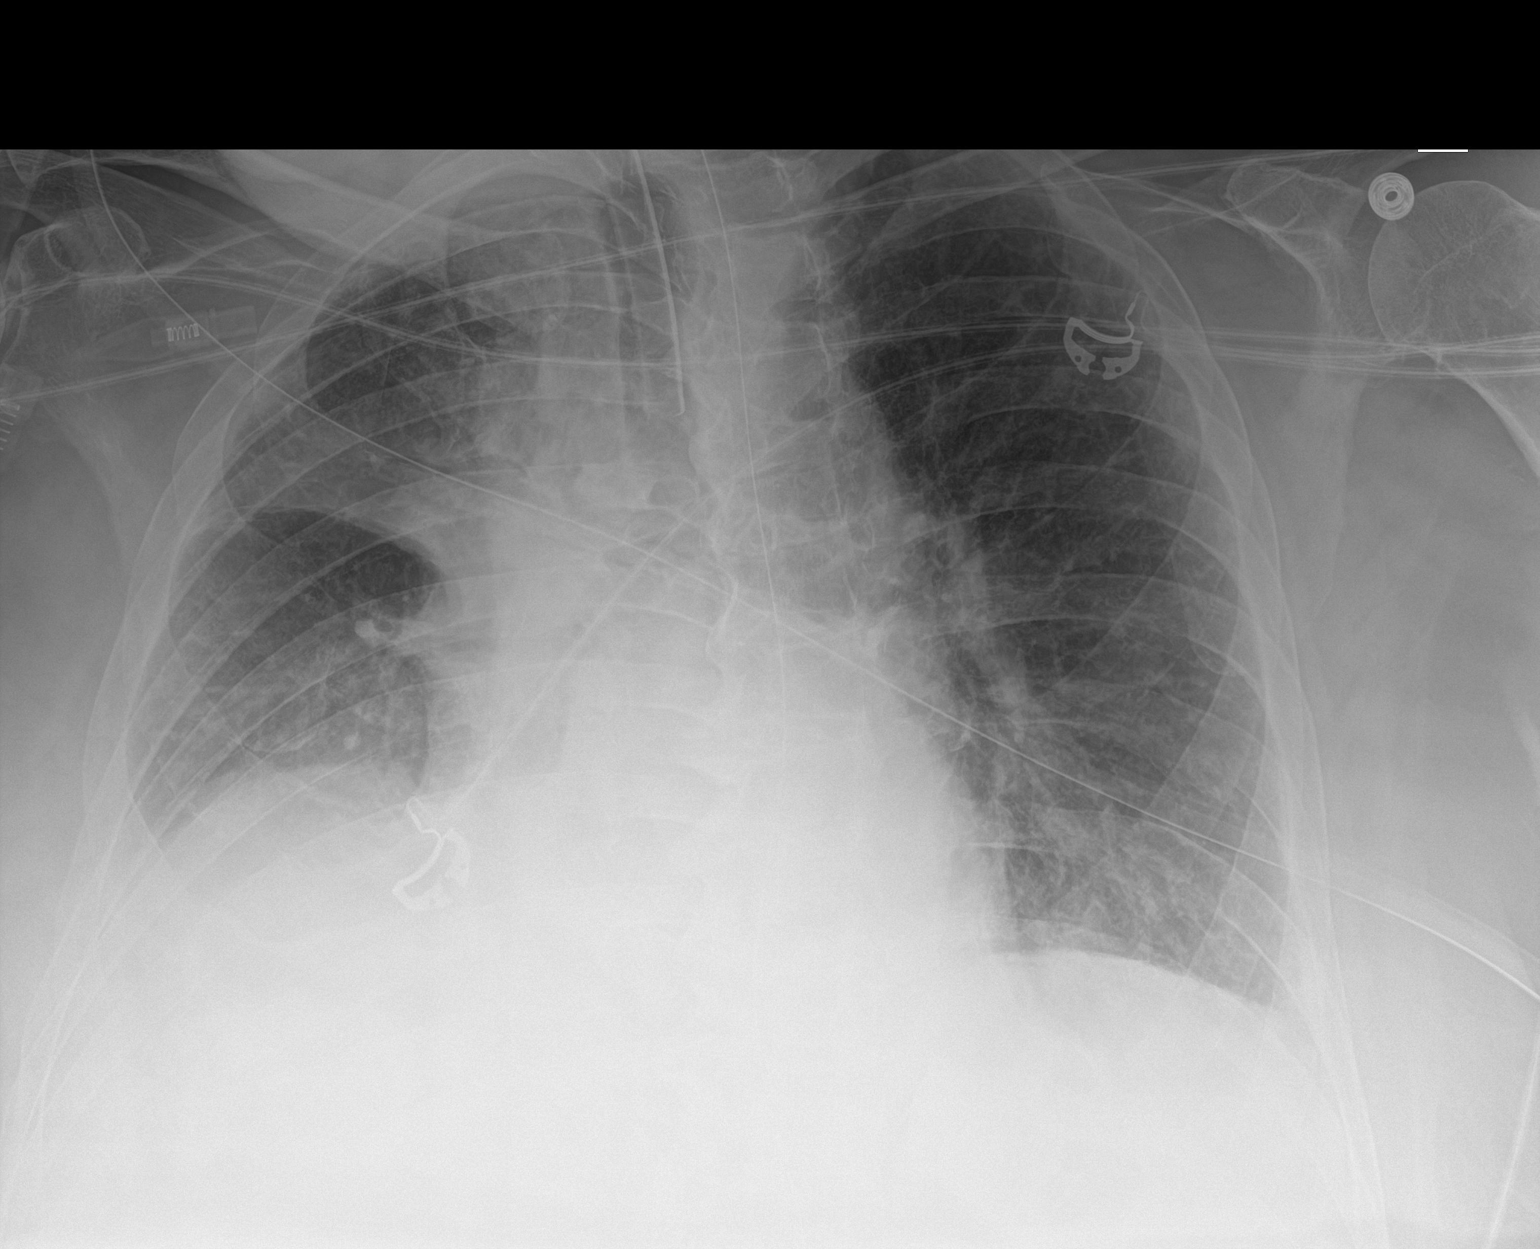

[1 of 1 positions shown; findings below may reference images not displayed]

FINDINGS: Heart size is normal. Aeration at the right base is improved. No
airspace opacity is present in the right upper lobe. Left lung is
clear. Endotracheal tube terminates 4 cm above the carina.
IMPRESSION: 1. Improving aeration at the right base.
2. Persistent right upper lobe airspace disease.
3. Endotracheal tube terminates 4 cm above the carina.

## 2020-01-07 MED ORDER — BISACODYL 10 MG RE SUPP
10.0000 mg | Freq: Once | RECTAL | Status: AC
  Administered 2020-01-07: 10 mg via RECTAL
  Filled 2020-01-07: qty 1

## 2020-01-07 MED ORDER — DEXTROSE 10 % IV SOLN: INTRAVENOUS | Status: DC

## 2020-01-07 MED ORDER — INSULIN DETEMIR 100 UNIT/ML ~~LOC~~ SOLN
31.0000 [IU] | Freq: Two times a day (BID) | SUBCUTANEOUS | Status: DC
  Administered 2020-01-07: 31 [IU] via SUBCUTANEOUS
  Filled 2020-01-07 (×3): qty 0.31

## 2020-01-07 MED ORDER — MIDAZOLAM HCL 2 MG/2ML IJ SOLN: 2.0000 mg | INTRAMUSCULAR | Status: DC | PRN

## 2020-01-07 MED ORDER — INSULIN ASPART 100 UNIT/ML ~~LOC~~ SOLN
7.0000 [IU] | SUBCUTANEOUS | Status: DC
  Administered 2020-01-07 – 2020-01-08 (×6): 7 [IU] via SUBCUTANEOUS

## 2020-01-07 MED ORDER — DOCUSATE SODIUM 50 MG/5ML PO LIQD: 100.0000 mg | Freq: Two times a day (BID) | ORAL | Status: DC

## 2020-01-07 MED ORDER — PIPERACILLIN-TAZOBACTAM 3.375 G IVPB
3.3750 g | Freq: Three times a day (TID) | INTRAVENOUS | Status: DC
  Administered 2020-01-07 – 2020-01-08 (×3): 3.375 g via INTRAVENOUS
  Filled 2020-01-07 (×4): qty 50

## 2020-01-07 MED ORDER — MIDAZOLAM HCL 2 MG/2ML IJ SOLN
2.0000 mg | INTRAMUSCULAR | Status: DC | PRN
  Administered 2020-01-07: 2 mg via INTRAVENOUS

## 2020-01-07 MED ORDER — INSULIN ASPART 100 UNIT/ML ~~LOC~~ SOLN
3.0000 [IU] | SUBCUTANEOUS | Status: DC
  Administered 2020-01-07: 6 [IU] via SUBCUTANEOUS
  Administered 2020-01-07 (×2): 3 [IU] via SUBCUTANEOUS
  Administered 2020-01-07: 6 [IU] via SUBCUTANEOUS
  Administered 2020-01-08 (×2): 3 [IU] via SUBCUTANEOUS

## 2020-01-07 MED ORDER — MIDAZOLAM HCL 2 MG/2ML IJ SOLN
1.0000 mg | INTRAMUSCULAR | Status: DC | PRN
  Administered 2020-01-08: 1 mg via INTRAVENOUS

## 2020-01-07 MED ORDER — MIDAZOLAM BOLUS VIA INFUSION
1.0000 mg | INTRAVENOUS | Status: DC | PRN
  Administered 2020-01-08: 4 mg via INTRAVENOUS
  Administered 2020-01-08 (×2): 2 mg via INTRAVENOUS
  Administered 2020-01-08: 1 mg via INTRAVENOUS
  Filled 2020-01-07: qty 2

## 2020-01-07 MED ORDER — MIDAZOLAM 50MG/50ML (1MG/ML) PREMIX INFUSION
0.0000 mg/h | INTRAVENOUS | Status: DC
  Administered 2020-01-07: 2 mg/h via INTRAVENOUS
  Administered 2020-01-08: 5 mg/h via INTRAVENOUS
  Administered 2020-01-08: 2 mg/h via INTRAVENOUS
  Administered 2020-01-09: 10 mg/h via INTRAVENOUS
  Administered 2020-01-09: 7 mg/h via INTRAVENOUS
  Administered 2020-01-09: 15 mg/h via INTRAVENOUS
  Filled 2020-01-07 (×6): qty 50

## 2020-01-07 MED ORDER — MIDAZOLAM HCL 2 MG/2ML IJ SOLN: 1.0000 mg | INTRAMUSCULAR | Status: DC | PRN

## 2020-01-07 MED ORDER — POLYETHYLENE GLYCOL 3350 17 G PO PACK: 17.0000 g | PACK | Freq: Every day | ORAL | Status: DC

## 2020-01-07 MED ORDER — MIDAZOLAM HCL 2 MG/2ML IJ SOLN
INTRAMUSCULAR | Status: AC
  Filled 2020-01-07: qty 2

## 2020-01-07 MED FILL — Fentanyl Citrate Preservative Free (PF) Inj 100 MCG/2ML: INTRAMUSCULAR | Qty: 2 | Status: AC

## 2020-01-07 NOTE — Progress Notes (Signed)
During wake-up assessment period, turned off Propofol and left Precedex at same rate. Patient became tachycardic in the 180-190s, and respirations were in the 40s and 50s per minute. Resumed Propofol at previous rate, and multiple PRN doses of Fentanyl given, along with a PRN dose of Versed 2mg  per Dr. . Family notified on arrival. 0905 update: Patient converted back to NSR in the 80s, sats at 100% with RR of 28.

## 2020-01-07 NOTE — Progress Notes (Signed)
NAME:  Isaac Hamilton, MRN:  [id number], DOB:  [birth year], LOS: 5 ADMISSION DATE:  12/31/2019, CONSULTATION DATE:  01/03/2020 REFERRING MD:  Lorin Mercy - TRH, CHIEF COMPLAINT:  Low back pain, fatigue , AMS + MRSA Bacteremia  Brief History   69 yo M transferred to [facility] to [facility] service 6/18. Upon arrival, unresponsive. PCCM consulted for evaluation. Neurology consulted as code stroke   History of present illness   69 yo M PMH DM, Afib, hx MRSA who transferred to [facility] from [place] with reported MRSA septicemia and inability to obtain MRI due to imaging; admitted to [facility] service, reportedly AOOx4 prior to transfer. Admitted to [facility] for possible back strain in setting of helping sig other move a scooter. Found to have MRSA bacteremia, and worsening malaise, fatigue. Hospitalization at [facility] complicated by AKI, thrombocytopenia, transaminitis, toxic encephalopathy. TEE not pursued due to thrombocytopenia.  Arrives to [facility] on dilt gtt, and unresponsive with L sided weakness. Concern that patient is not protecting airway; PCCM consulted and neurology paged for "code stroke."   PCCM and neurology at bedside with rapid response and RRT.    Past Medical History  DM2 Afib Chronic back pain HLD OA HTN Morbid obesity   Significant Hospital Events   6/18 arrives to [facility], unresponsive.   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  Phlegmon at L3-S1 without discrete abscess.  No definite evidence of discitis.  Mod to severe spinal stenosis and foraminal stenosis. 6/20 MRI cspine > no abscess or discitis.  Mild marrow edema. 6/22 RUQ [place]: fatty liver, normal GB  Micro Data:  CSF 6/19 > MRSA Sputum 6/19 > neg 6/22 BAL RLL> rare PMN  Antimicrobials:    Vanc 6/18 > Rifampin 6/21 >   Interim history/subjective:  Agitation again with weaning sedation this morning.  Objective   Blood pressure (!) 130/57, pulse (!) 103, temperature (!) 102.7 F (39.3 C), temperature source Axillary, resp. rate (!) 34, height _0  (1.778 m), weight (!) 153 kg, SpO2 100 %.    Vent Mode: PRVC FiO2 (%):  [40 %-100 %] 40 % Set Rate:  [15 bmp] 15 bmp Vt Set:  [580 mL] 580 mL PEEP:  [5 cmH20] 5 cmH20 Pressure Support:  [5 cmH20] 5 cmH20 Plateau Pressure:  [24 cmH20] 24 cmH20   Intake/Output Summary (Last 24 hours) at 01/07/2020 1626 Last data filed at 01/07/2020 1200 Gross per 24 hour  Intake 3501.64 ml  Output 1050 ml  Net 2451.64 ml   Filed Weights   01/05/20 0354 01/06/20 0500 01/07/20 0500  Weight: (!) 149 kg (!) 152 kg (!) 153 kg    Examination: General: Critically ill-appearing elderly man lying in bed in no acute distress, intubated, sedated Neuro: Sedated, remains tachypneic breathing above the vent.  Pupils reactive and symmetric.  Not withdrawing to pain in any extremity.  Intact cough reflex. HEENT: [place]/AT, eyes minimally icteric with scleral edema.  ETT and OGT.   Cardiovascular: Tachycardic, regular rhythm.  No murmurs. Lungs: Breathing above the vent, mild rhonchi on the right.  Moderate bloody secretions from ET tube. P plat <30 Abdomen: Soft, nontender, nondistended.  Hypoactive bowel sounds. Musculoskeletal: No clubbing, cyanosis, or edema Skin: No wounds or rashes.  Pallor.  CXR personally reviewed-  RLL & RUL infiltrate  Assessment & Plan:   Critically ill due to acute respiratory failure in setting of encephalopathy and inability to protect airway causing acute respiratory failure, requiring intubation & mechanical ventilation.  -Continue low tidal volume ventilation with goal plateau pressure less than 30 and driving pressure less than 15.  Titrate PEEP and FiO2 per ARDS protocol to maintain saturations greater than  88%. -Daily SAT and SBT.  Failing SAT due to severe tachypnea, increased work of breathing, desaturations. -Mental status remains major barrier to extubation. -VAP prevention protocol.  Acute Encephalopathy 2/2 septic encephalopathy in setting of severe sepsis due to MRSA bacteremia and meningitis. Septic spinal facet arthritis with phlegmon but no definitive abscess or discitis. -con't vanc & rifampin per ID recommendations; will require prolonged course for septic arthritis -no role for TEE at this time -Prognosis remains very poor with meningitis and prolonged failure mental status to improve.  Atrial fibrillation with rapid ventricular response, requiring amiodarone and diltiazem infusions for rate control.  Now in sinus tach. -Discontinue amiodarone, and transition to metoprolol BID. -Continue to hold anticoagulation given hemoptysis -Optimize electrolytes  RLL & RUL lobar pneumonia with hemoptysis -Broaden antibiotics to cover gram negatives.  Adding Zosyn. -Chest PT  Hx DM with hyperglycemia -Continue sliding scale insulin as needed -Continue Levemir 31 units twice daily -Continue holding PTA oral hypoglycemics -Increasing tube feed coverage to 7 units every 4 hours with hold parameters -Goal blood glucose 140-180 while admitted to the ICU  Acute on chronic anemia likely 2/2 critical illness -con't to monitor -Transfuse for hemoglobin less than 7 or hemodynamically significant bleeding  GOC -family understands guarded prognosis and wants to change code status to DNR  Best practice:  Diet: Continue tube feeding Pain/Anxiety/Delirium protocol (if indicated): Continue propofol, fentanyl, precedex.  Daily wake up assessment. VAP protocol (if indicated): Bundle in place. DVT prophylaxis: SCDs/Heparin GI prophylaxis: protonix  Glucose control: SSI  Mobility: BR Code Status: Full  Family Communication: Sons updated at bedside today Disposition: ICU   This patient is  critically ill with multiple organ system failure which requires frequent high complexity decision making, assessment, support, evaluation, and titration of therapies. This was completed through the application of advanced monitoring technologies and extensive interpretation of multiple databases. During this encounter critical care time was devoted to patient care services described in this note for 65 minutes.   Julian Hy, DO 01/07/20 4:26 PM [place] [facility]

## 2020-01-07 NOTE — Progress Notes (Signed)
Pharmacy Antibiotic Note  Isaac Hamilton is a 69 y.o. male transferred to [facility] from [facility] on 01/14/2020 with MRSA bacteremia and mental status changes, with concern for meningitis and endocarditis. Hospitalization at [facility] was complicated by thrombocytopenia; TEE not pursed there due to thrombocytopenia. LP not able to be done at OSH, due to pt's body habitus at OSH. Pharmacy has been consulted for vancomycin dosing for MRSA bacteremia/meningitis. Concern for pneumonia on CXR and remains febrile - Adding Zosyn for gram negative coverage (avoiding Cefepime w/ neuro issues at this time).   Vancomycin trough today = 20 on 1250 IV every 12 hours. SCr 1.03- stable for admission. WBC down to within normal limits.  Plan: -Add Zosyn 3.376g IV every 8 hours- extended infusion -Continue Vancomycin 1250mg  IV q12h -Follow renal function closely - any bump and will need to decrease dose -Recheck trough at new Css   Height: 5\' 10"  (177.8 cm) Weight: (!) 153 kg (337 lb 4.9 oz) IBW/kg (Calculated) : 73  Temp (24hrs), Avg:100.8 F (38.2 C), Min:99.5 F (37.5 C), Max:103 F (39.4 C)  Recent Labs  Lab 12/25/2019 1800 12/19/2019 1800 12/27/2019 1911 01/03/20 0508 01/03/20 0508 01/03/20 1324 01/03/20 1804 01/04/20 0734 01/05/20 0348 01/05/20 1945 01/06/20 0332 01/06/20 0658 01/07/20 0320  WBC 23.4*   < >  --  23.6*  --   --   --  13.6* 13.9*  --   --  9.8 10.5  CREATININE 0.87   < >  --  1.12   < > 1.09  --  0.93 0.92  --  0.95  --  1.03  LATICACIDVEN 2.4*  --  2.4*  --   --   --   --   --   --   --   --   --   --   VANCOTROUGH  --   --   --   --   --   --  26*  --   --  24*  --   --   --    < > = values in this interval not displayed.    Estimated Creatinine Clearance: 101.9 mL/min (by C-G formula based on SCr of 1.03 mg/dL).    No Known Allergies  Antimicrobials this admission: Ampicillin 6/18>>6/20 Ceftriaxone 6/18>>6/20 Vancomycin 6/18>> Rifampin 6/21>> Zosyn 6/23  >> 6/21 VT 24 (11hrs post dose; dose changed to 1250 q12)  Microbiology results: 6/18 BC: NGTD 6/18 Ucx: NGTD  6/19 Resp cx: NGTD 6/19 CSF: MRSA (MIC 1 Vanc, Rifampin S) 6/20 Bcx: NGTD  Cultures at OSH (per pharmacist): 6/13 Bld cx: MRSA  6/15 Bld cx: GPC in clusters 6/17 Bld cx: GPC in clusters   Thank you for allowing pharmacy to be a part of this patient's care.  7/15, PharmD, BCPS, BCCCP Clinical Pharmacist Please refer to [facility] for [facility] Pharmacy numbers  01/07/2020 11:00 AM   [facility] pharmacy phone numbers are listed on [email or web address]

## 2020-01-07 NOTE — Progress Notes (Signed)
Patient ID: Isaac Hamilton, male   DOB: [birth year], 69 y.o.   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  He now has ventilator associated pneumonia as well.  I discussed his extremely poor prognosis with his 2 sons.  They say that in his [place] he made it clear that he did not want to be kept alive on a machine.  They said "they have a lot to discuss tonight".  PLAN: 1. Continue vancomycin, rifampin and piperacillin pending further discussion of goals of care.  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 amiodarone  150 mg Intravenous Once  . aspirin  81 mg Per Tube Daily  . atorvastatin  10 mg Per Tube Daily  . chlorhexidine gluconate (MEDLINE KIT)  15 mL Mouth Rinse BID  . Chlorhexidine Gluconate Cloth  6 each Topical Daily  . docusate  100 mg Per Tube BID  . feeding supplement (PRO-STAT SUGAR FREE 64)  30 mL Per Tube QID  . fentaNYL (SUBLIMAZE) injection  25 mcg Intravenous Once  . heparin injection (subcutaneous)  5,000 Units Subcutaneous Q8H  . insulin aspart  3-9 Units Subcutaneous Q4H  . insulin aspart  7 Units Subcutaneous Q4H  . insulin detemir  31 Units Subcutaneous BID  . mouth rinse  15 mL Mouth Rinse 10 times per day  . pantoprazole (PROTONIX) IV  40 mg Intravenous QHS  . polyethylene glycol  17 g Per Tube Daily  . sodium chloride flush  10-40 mL Intracatheter Q12H   Continuous Infusions: . amiodarone 30 mg/hr (01/07/20 1200)  . dexmedetomidine (PRECEDEX) IV  infusion 1.2 mcg/kg/hr (01/07/20 1459)  . dextrose Stopped (01/07/20 1443)  . diltiazem (CARDIZEM) infusion 15 mg/hr (01/07/20 1200)  . feeding supplement (VITAL HIGH PROTEIN) 1,000 mL (01/06/20 2200)  . fentaNYL infusion INTRAVENOUS 200 mcg/hr (01/07/20 1403)  . piperacillin-tazobactam (ZOSYN)  IV 3.375 g (01/07/20 1420)  . propofol (DIPRIVAN) infusion 30 mcg/kg/min (01/07/20 1527)  . rifampin (RIFADIN) IVPB Stopped (01/07/20 0951)  . vancomycin 1,250 mg (01/07/20 1203)   PRN Meds:.acetaminophen (TYLENOL) oral liquid 160 mg/5 mL, docusate sodium, fentaNYL, midazolam, polyethylene glycol, sodium chloride flush  Review of Systems: Review of Systems  Unable to perform ROS: Intubated    No Known Allergies  OBJECTIVE: Vitals:   01/07/20 1345 01/07/20 1400 01/07/20 1415 01/07/20 1430  BP: (!) 101/49 (!) 103/49 (!) 105/48 (!) 148/60  Pulse: 72 73 77 86  Resp: (!) 26 (!) 25 (!) 27 (!) 32  Temp:      TempSrc:      SpO2: 100% 100% 99% 99%  Weight:      Height:       Body mass index is 48.4 kg/m.  Physical Exam Constitutional:      Comments: He remains unresponsive on the ventilator.  Cardiovascular:     Comments: Very distant heart sounds. Pulmonary:     Breath sounds: Normal breath sounds.  Lab Results Lab Results  Component Value Date   WBC 10.5 01/07/2020   HGB 9.5 (L) 01/07/2020   HCT 29.4 (L) 01/07/2020   MCV 105.4 (H) 01/07/2020   PLT 139 (L) 01/07/2020    Lab Results  Component Value Date   CREATININE 1.03 01/07/2020   BUN 45 (H) 01/07/2020   NA 147 (H) 01/07/2020   K 4.7 01/07/2020   CL 121 (H) 01/07/2020   CO2 18 (L) 01/07/2020    Lab Results  Component Value Date   ALT 37 01/06/2020   AST 56 (H) 01/06/2020   ALKPHOS 142 (H) 01/06/2020   BILITOT 4.0 (H) 01/06/2020     Microbiology: Recent Results (from the past 240 hour(s))  Urine culture     Status: None   Collection Time: 01/10/2020  4:10 PM   Specimen: Urine, Random  Result Value Ref  Range Status   Specimen Description URINE, RANDOM  Final   Special Requests NONE  Final   Culture   Final    NO GROWTH Performed at [facility], [address] [place], [place] [address]    Report Status 01/04/2020 FINAL  Final  Culture, blood (routine x 2)     Status: None   Collection Time: 01/11/2020  6:08 PM   Specimen: BLOOD RIGHT HAND  Result Value Ref Range Status   Specimen Description BLOOD RIGHT HAND  Final   Special Requests   Final    BOTTLES DRAWN AEROBIC AND ANAEROBIC Blood Culture adequate volume   Culture   Final    NO GROWTH 5 DAYS Performed at [facility], [place] [address] [place], [place] [address]    Report Status 01/07/2020 FINAL  Final  Culture, blood (routine x 2)     Status: None   Collection Time: 12/19/2019  6:20 PM   Specimen: BLOOD LEFT HAND  Result Value Ref Range Status   Specimen Description BLOOD LEFT HAND  Final   Special Requests   Final    BOTTLES DRAWN AEROBIC ONLY Blood Culture results may not be optimal due to an inadequate volume of blood received in culture bottles   Culture   Final    NO GROWTH 5 DAYS Performed at [facility], [place] [address] [place], [place] [address]    Report Status 01/07/2020 FINAL  Final  CSF culture     Status: None   Collection Time: 01/03/20  4:26 PM   Specimen: PATH Cytology CSF; Cerebrospinal Fluid  Result Value Ref Range Status   Specimen Description CSF  Final   Special Requests NONE  Final   Gram Stain   Final    RARE WBC PRESENT, PREDOMINANTLY PMN RARE GRAM POSITIVE COCCI CRITICAL RESULT CALLED TO, READ BACK BY AND VERIFIED WITH: RN Felicity Pellegrini [id number] MLM Performed at [facility], [address], [place], [place] [address]    Culture FEW METHICILLIN RESISTANT STAPHYLOCOCCUS AUREUS  Final   Report Status 01/05/2020 FINAL  Final   Organism ID, Bacteria METHICILLIN RESISTANT STAPHYLOCOCCUS AUREUS  Final      Susceptibility   Methicillin resistant staphylococcus aureus - MIC*     CIPROFLOXACIN >=8 RESISTANT Resistant     ERYTHROMYCIN >=8 RESISTANT Resistant     GENTAMICIN <=0.5 SENSITIVE Sensitive     OXACILLIN >=4 RESISTANT Resistant     TETRACYCLINE <=1 SENSITIVE Sensitive     VANCOMYCIN 1 SENSITIVE Sensitive     TRIMETH/SULFA <=10 SENSITIVE Sensitive  CLINDAMYCIN RESISTANT Resistant     RIFAMPIN <=0.5 SENSITIVE Sensitive     Inducible Clindamycin POSITIVE Resistant     * FEW METHICILLIN RESISTANT STAPHYLOCOCCUS AUREUS  Culture, respiratory (tracheal aspirate)     Status: None   Collection Time: 01/03/20  8:52 PM   Specimen: Tracheal Aspirate; Respiratory  Result Value Ref Range Status   Specimen Description TRACHEAL ASPIRATE  Final   Special Requests NONE  Final   Gram Stain   Final    RARE WBC PRESENT, PREDOMINANTLY PMN NO ORGANISMS SEEN    Culture   Final    NO GROWTH Performed at [facility], [place] [address], [place], [place] [address]    Report Status 01/06/2020 FINAL  Final  Culture, blood (Routine X 2) w Reflex to ID Panel     Status: None (Preliminary result)   Collection Time: 01/04/20  7:34 AM   Specimen: BLOOD RIGHT HAND  Result Value Ref Range Status   Specimen Description BLOOD RIGHT HAND  Final   Special Requests   Final    BOTTLES DRAWN AEROBIC ONLY Blood Culture adequate volume   Culture   Final    NO GROWTH 3 DAYS Performed at [facility], [address] [place], [place] [address]    Report Status PENDING  Incomplete  Culture, blood (Routine X 2) w Reflex to ID Panel     Status: None (Preliminary result)   Collection Time: 01/04/20  7:34 AM   Specimen: BLOOD LEFT HAND  Result Value Ref Range Status   Specimen Description BLOOD LEFT HAND  Final   Special Requests   Final    BOTTLES DRAWN AEROBIC ONLY Blood Culture adequate volume   Culture   Final    NO GROWTH 3 DAYS Performed at [facility], [place] [address] [place], [place] [address]    Report Status PENDING  Incomplete  Culture, bal-quantitative      Status: None (Preliminary result)   Collection Time: 01/06/20  4:55 PM   Specimen: Bronchoalveolar Lavage; Respiratory  Result Value Ref Range Status   Specimen Description BRONCHIAL ALVEOLAR LAVAGE  Final   Special Requests NONE  Final   Gram Stain   Final    RARE WBC PRESENT, PREDOMINANTLY PMN NO ORGANISMS SEEN    Culture   Final    NO GROWTH < 24 HOURS Performed at [facility] [address], [place], [place] [address]    Report Status PENDING  Incomplete    Michel Bickers, MD [facility] [phone] pager   [phone] cell 01/07/2020, 3:32 PM

## 2020-01-07 NOTE — Progress Notes (Signed)
Audible cuff leak heard despite adequate cuff pressure.  ETT advance 2 cm which resolved the cuff leak.  ETT secured at 25cm at the lips. Pt getting good Vt and Ve volumes. Will verify placement with morning x-ray.

## 2020-01-07 NOTE — Plan of Care (Addendum)
Code status changed to DNR per family's request given his guarded prognosis.  Steffanie Dunn, DO 01/07/20 4:27 PM [facility]   Family planning for withdrawal of aggressive care tomorrow. Liberalized visitation. Will start versed infusion tonight to help avoid agitation and air hunger.  Steffanie Dunn, DO 01/07/20 6:27 PM [facility]

## 2020-01-07 NOTE — Progress Notes (Signed)
CSW attempted to reach patient's son Reuel Boom to discuss the financial concerns - no answer so a voicemail was left requesting a return call.  Edwin Dada, MSW, LCSW-A Transitions of Care  Clinical Social Worker  [facility] Emergency Departments  Medical ICU [phone]

## 2020-01-08 DIAGNOSIS — Z22322 Carrier or suspected carrier of Methicillin resistant Staphylococcus aureus: Secondary | ICD-10-CM

## 2020-01-08 DIAGNOSIS — M0008 Staphylococcal arthritis, vertebrae: Secondary | ICD-10-CM

## 2020-01-08 DIAGNOSIS — Z9911 Dependence on respirator [ventilator] status: Secondary | ICD-10-CM

## 2020-01-08 LAB — CULTURE, BAL-QUANTITATIVE W GRAM STAIN: Culture: NO GROWTH

## 2020-01-08 LAB — GLUCOSE, CAPILLARY
Glucose-Capillary: 142 mg/dL — ABNORMAL HIGH (ref 70–99)
Glucose-Capillary: 142 mg/dL — ABNORMAL HIGH (ref 70–99)

## 2020-01-08 LAB — TRIGLYCERIDES: Triglycerides: 185 mg/dL — ABNORMAL HIGH (ref <150)

## 2020-01-08 MED ORDER — ACETAMINOPHEN 650 MG RE SUPP: 650.0000 mg | Freq: Four times a day (QID) | RECTAL | Status: DC | PRN

## 2020-01-08 MED ORDER — FENTANYL BOLUS VIA INFUSION
50.0000 ug | INTRAVENOUS | Status: DC | PRN
  Administered 2020-01-08 (×2): 50 ug via INTRAVENOUS
  Filled 2020-01-08: qty 50

## 2020-01-08 MED ORDER — POLYVINYL ALCOHOL 1.4 % OP SOLN: 1.0000 [drp] | Freq: Four times a day (QID) | OPHTHALMIC | Status: DC | PRN

## 2020-01-08 MED ORDER — ORAL CARE MOUTH RINSE
15.0000 mL | Freq: Two times a day (BID) | OROMUCOSAL | Status: DC
  Administered 2020-01-08: 15 mL via OROMUCOSAL

## 2020-01-08 MED ORDER — IPRATROPIUM-ALBUTEROL 0.5-2.5 (3) MG/3ML IN SOLN: 3.0000 mL | Freq: Four times a day (QID) | RESPIRATORY_TRACT | Status: DC | PRN

## 2020-01-08 MED ORDER — DIPHENHYDRAMINE HCL 50 MG/ML IJ SOLN: 25.0000 mg | INTRAMUSCULAR | Status: DC | PRN

## 2020-01-08 MED ORDER — GLYCOPYRROLATE 0.2 MG/ML IJ SOLN: 0.2000 mg | INTRAMUSCULAR | Status: DC | PRN

## 2020-01-08 MED ORDER — GLYCOPYRROLATE 0.2 MG/ML IJ SOLN
0.2000 mg | INTRAMUSCULAR | Status: DC | PRN
  Administered 2020-01-08 – 2020-01-09 (×5): 0.2 mg via INTRAVENOUS
  Filled 2020-01-08 (×5): qty 1

## 2020-01-08 MED ORDER — FENTANYL CITRATE (PF) 100 MCG/2ML IJ SOLN
100.0000 ug | Freq: Once | INTRAMUSCULAR | Status: AC
  Administered 2020-01-08: 100 ug via INTRAVENOUS

## 2020-01-08 MED ORDER — HALOPERIDOL LACTATE 5 MG/ML IJ SOLN
2.5000 mg | INTRAMUSCULAR | Status: DC | PRN
  Administered 2020-01-09: 5 mg via INTRAVENOUS
  Filled 2020-01-08: qty 1

## 2020-01-08 MED ORDER — ACETAMINOPHEN 325 MG PO TABS: 650.0000 mg | ORAL_TABLET | Freq: Four times a day (QID) | ORAL | Status: DC | PRN

## 2020-01-08 MED ORDER — GLYCOPYRROLATE 1 MG PO TABS: 1.0000 mg | ORAL_TABLET | ORAL | Status: DC | PRN

## 2020-01-08 NOTE — Progress Notes (Signed)
Assisted tele visit to patient with family member.  Ezella Kell P, RN  

## 2020-01-08 NOTE — Progress Notes (Signed)
NAME:  Isaac Hamilton, MRN:  [id number], DOB:  July 15, 1951, LOS: 6 ADMISSION DATE:  01/05/2020, CONSULTATION DATE:  12/23/2019 REFERRING MD:  Lorin Mercy - TRH, CHIEF COMPLAINT:  Low back pain, fatigue , AMS + MRSA Bacteremia  Brief History   69 yo M transferred to [facility] to [facility] service 6/18. Upon arrival, unresponsive. PCCM consulted for evaluation. Neurology consulted as code stroke   History of present illness   69 yo M PMH DM, Afib, hx MRSA who transferred to [facility] from [place] with reported MRSA septicemia and inability to obtain MRI due to imaging; admitted to [facility] service, reportedly AOOx4 prior to transfer. Admitted to [facility] for possible back strain in setting of helping sig other move a scooter. Found to have MRSA bacteremia, and worsening malaise, fatigue. Hospitalization at [facility] complicated by AKI, thrombocytopenia, transaminitis, toxic encephalopathy. TEE not pursued due to thrombocytopenia.  Arrives to [facility] on dilt gtt, and unresponsive with L sided weakness. Concern that patient is not protecting airway; PCCM consulted and neurology paged for "code stroke."   PCCM and neurology at bedside with rapid response and RRT.    Past Medical History  DM2 Afib Chronic back pain HLD OA HTN Morbid obesity   Significant Hospital Events   6/18 arrives to [facility], unresponsive.   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  Phlegmon at L3-S1 without discrete abscess.  No definite evidence of discitis.  Mod to severe spinal stenosis and foraminal stenosis. 6/20 MRI cspine > no abscess or discitis.  Mild marrow edema. 6/22 RUQ [place]: fatty liver, normal GB  Micro Data:  CSF 6/19 > MRSA Sputum 6/19 > neg 6/22 BAL RLL> rare PMN  Antimicrobials:    Vanc 6/18 > Rifampin 6/21 >   Interim history/subjective:  Family at bedside for terminal withdrawal of care.   Objective   Blood pressure (!) 92/48, pulse 70, temperature 99.4 F (37.4 C), temperature source Axillary, resp. rate (!) 29, height _0  (1.778 m), weight (!) 151 kg, SpO2 99 %.    Vent Mode: PRVC FiO2 (%):  [40 %] 40 % Set Rate:  [15 bmp] 15 bmp Vt Set:  [580 mL] 580 mL PEEP:  [5 cmH20] 5 cmH20 Plateau Pressure:  [19 cmH20-24 cmH20] 19 cmH20   Intake/Output Summary (Last 24 hours) at 01/08/2020 1017 Last data filed at 01/08/2020 0800 Gross per 24 hour  Intake 4348.58 ml  Output 1780 ml  Net 2568.58 ml   Filed Weights   01/06/20 0500 01/07/20 0500 01/08/20 0500  Weight: (!) 152 kg (!) 153 kg (!) 151 kg    Examination: General: critically ill appearing man in NAD, intubated and sedated. Neuro: intubated, sedated. No withdrawal from pain or movement at all despite propofol being discontinued. PERRL. +cough reflex. HEENT: [place]/AT, eyes anicteric. Oral mucosa moist. Cardiovascular: regular rate and rhythm Lungs: breathing above the event, less bloody secretions frmo ETT. No rhales, rhonchi on R. Abdomen: obese, soft, NT, ND. Hypoactive BS Musculoskeletal: + dependent edema, no cyanosis Skin: pallor, no rashes    Assessment & Plan:   Critically ill due to acute respiratory failure in setting of encephalopathy and inability to protect airway causing acute respiratory failure, requiring intubation & mechanical ventilation.  -family planning for terminal extubation and  comfort care today -medications as required to maintain appropriate comfort  Acute Encephalopathy 2/2 septic encephalopathy in setting of severe sepsis due to MRSA bacteremia and meningitis. Concern for significant neurological injury from meningitis. We discussed that hydrocephalus is a possible complication from meningitis, and could contribute to some of his acute encephalopathy but could also be  associated with poor long-term prognosis regardless. Septic spinal facet arthritis with phlegmon but no definitive abscess or discitis. -family understands poor prognosis in terms of functional recovery and relates that he would not want to live without his pre-morbid functional status. D/c antibiotics.  Atrial fibrillation with rapid ventricular response, requiring amiodarone and diltiazem infusions for rate control.  Now in sinus tach. -con't diltiazem  -con't amio  RLL & RUL lobar pneumonia with hemoptysis -d/c antibiotics  Hx DM with hyperglycemia -d/c accuchecks and insulin  Acute on chronic anemia likely 2/2 critical illness -no additional intervention or monitoring  GOC -family understands guarded prognosis and wishes to proceed with withdrawal of aggressive care -comfort care orders placed  Best practice:  Diet: Continue tube feeding Pain/Anxiety/Delirium protocol (if indicated): Continue propofol, fentanyl, precedex.  Daily wake up assessment. VAP protocol (if indicated): Bundle in place. DVT prophylaxis: SCDs/Heparin GI prophylaxis: protonix  Glucose control: SSI  Mobility: BR Code Status: Full  Family Communication: Sons updated at bedside today Disposition: ICU   This patient is critically ill with multiple organ system failure which requires frequent high complexity decision making, assessment, support, evaluation, and titration of therapies. This was completed through the application of advanced monitoring technologies and extensive interpretation of multiple databases. During this encounter critical care time was devoted to patient care services described in this note for 38 minutes.   Julian Hy, DO 01/08/20 10:23 AM [facility]

## 2020-01-08 NOTE — Progress Notes (Signed)
Patient still intubated and continues to require sedation prevent agitation.  Discussed with Dr. Chestine Spore about repeat brain imaging to see if patient has developed hydrocephalus since 6/18, if present may need repeat LP versus ventriculostomy.  Dr. Chestine Spore stated she would discuss with family, however given that continues to have other serious medical complications from disseminated bacteremia, prognosis appears poor and family appears to be inclined for comfort care.  Reviewing the notes, it appears Dr. Chestine Spore did mention this to the family however they have declined and patient has been palliatively extubated.  Neurology will be available as needed

## 2020-01-08 NOTE — Progress Notes (Signed)
Nutrition Brief Note  Chart reviewed. Pt now transitioning to comfort care.  No further nutrition interventions warranted at this time.  Please re-consult as needed.   Bion Todorov H, RD, LDN, CNSC Please refer to Amion for contact information.                                                          

## 2020-01-08 NOTE — Procedures (Signed)
Extubation Procedure Note  Patient Details:   Name: Isaac Hamilton DOB: Dec 25, 1950 MRN: [id number]   Airway Documentation:    Vent end date: 01/08/20 Vent end time: 1055   Evaluation  O2 sats: currently acceptable Complications: No apparent complications Patient did tolerate procedure well. Bilateral Breath Sounds: Rhonchi, Diminished   No   Pt was extubated to comfort care measures at 1055 per MD order and pt family request.   Trey Sailors 01/08/2020, 10:59 AM

## 2020-01-08 NOTE — Progress Notes (Signed)
Assisted tele visit to patient with family member.  Bailen Geffre P, RN  

## 2020-01-09 DIAGNOSIS — Z515 Encounter for palliative care: Secondary | ICD-10-CM

## 2020-01-09 DIAGNOSIS — G9341 Metabolic encephalopathy: Secondary | ICD-10-CM

## 2020-01-09 LAB — CULTURE, BLOOD (ROUTINE X 2)
Culture: NO GROWTH
Culture: NO GROWTH
Special Requests: ADEQUATE
Special Requests: ADEQUATE

## 2020-01-15 NOTE — Progress Notes (Signed)
Continuing w/ comfort measures. Sons at bs and emotional support provided.

## 2020-01-15 NOTE — Progress Notes (Signed)
Patient transitioned peacefully at 44 w/ family at bs. Emotional support provided.

## 2020-01-15 NOTE — Death Summary Note (Signed)
Physician Discharge Summary  Patient ID: Isaac Hamilton MRN: [id number] DOB/AGE: Jun 22, 1951 69 y.o.  Admit date: 01/01/2020 Discharge date: 27-Jan-2020  Admission Diagnoses:  MRSA bacteremia Sepsis Acute hypoxic respiratory failure A-fib with RVr AKI HTN DM2 w/ hyperglycemia   Discharge Diagnoses:  Principal Problem:   MRSA bacteremia Active Problems:   Encounter for intubation   Meningitis   Discitis of thoracolumbar region   Morbid obesity (HCC)   Thrombocytopenia (HCC)   Atrial fibrillation (HCC)   Encephalopathy   Cerebral ventriculitis   Streptococcal arthritis of multiple sites [name]   Discharged Condition: deceased  Hospital Course: [name] was transferred from an outside hospital on 6/18 and acutely decompensated upon arrival.  He had severe encephalopathy with neuro deficits, acute respiratory failure necessitating intubation.  Brain MRI demonstrated findings concerning for meningitis, but no acute stroke.  Subsequent spine MRIs demonstrated multiple levels of septic facet arthritis.  No drainable abscesses or cord threatening lesions.  LP culture positive for MRSA.  He developed a right upper and lower pneumonia.  Despite appropriate antibiotics and medical management, his neurologic status continued to remain poor during sedation breaks with no change in alertness or reactivity, but significant respiratory decompensation requiring increased ventilator support and restarting sedation.  During these episodes he would also develop severe cardiac decompensation with uncontrolled A. fib with RVR.  After failure to improve on aggressive antibiotics and medical management after several days the family decided to convert to comfort-focused care.  He was terminally extubated on January 26, 2023 and passed away on 27-Jan-2020 at 15:02 with family at bedside.  Consults:  Infectious disease Neurology Interventional radiology  Significant Diagnostic Studies:  LP 6/19 Bronchoscopy 6/22 CSF  culture- MRSA  Imaging: 6/19 CSF consistent with ventriculities 6/20 MRI spine > suspected bilateral septic facet arthritis with associated fluid collections / abscesses posterior to facet joints.  Phlegmon at L3-S1 without discrete abscess.  No definite evidence of discitis.  Mod to severe spinal stenosis and foraminal stenosis. 6/20 MRI cspine > no abscess or discitis.  Mild marrow edema. 6/22 RUQ [place]: fatty liver, normal GB 6/23 CXR-right lower lobe and right upper lobe opacities  Treatments:  Antibiotics IVF Sedation MV, aggressive respiratory care rhythm control for Afib Palliative care for symptom management Insulin    Disposition: funeral home of the family's choosing    Signed: Steffanie Dunn 01/27/20, 3:21 PM

## 2020-01-15 NOTE — Progress Notes (Signed)
NAME:  Steve Youngberg, MRN:  [id number], DOB:  09-Oct-1950, LOS: 7 ADMISSION DATE:  12/27/2019, CONSULTATION DATE:  12/23/2019 REFERRING MD:  Lorin Mercy - TRH, CHIEF COMPLAINT:  Low back pain, fatigue , AMS + MRSA Bacteremia  Brief History   69 yo M transferred to [facility] to [facility] service 6/18. Upon arrival, unresponsive. PCCM consulted for evaluation. Neurology consulted as code stroke   History of present illness   69 yo M PMH DM, Afib, hx MRSA who transferred to [facility] from [place] with reported MRSA septicemia and inability to obtain MRI due to imaging; admitted to [facility] service, reportedly [id number] prior to transfer. Admitted to [facility] for possible back strain in setting of helping sig other move a scooter. Found to have MRSA bacteremia, and worsening malaise, fatigue. Hospitalization at [facility] complicated by AKI, thrombocytopenia, transaminitis, toxic encephalopathy. TEE not pursued due to thrombocytopenia.  Arrives to [facility] on dilt gtt, and unresponsive with L sided weakness. Concern that patient is not protecting airway; PCCM consulted and neurology paged for "code stroke."   PCCM and neurology at bedside with rapid response and RRT.    Past Medical History  DM2 Afib Chronic back pain HLD OA HTN Morbid obesity   Significant Hospital Events   6/18 arrives to [facility], unresponsive.   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  Phlegmon at L3-S1 without discrete abscess.  No definite evidence of discitis.  Mod to severe spinal stenosis and foraminal stenosis. 6/20 MRI cspine > no abscess or discitis.  Mild marrow edema. 6/22 RUQ [place]: fatty liver, normal GB  Micro Data:  CSF 6/19 > MRSA Sputum 6/19 > neg 6/22 BAL RLL> rare PMN  Antimicrobials:    Vanc 6/18 > 6/24 Rifampin 6/21 > 6/24 Zosyn 6/23  Interim history/subjective:  Family remains at bedside today.  Per nurse no issues with control and comfort  Objective   Blood pressure 121/65, pulse 85, temperature 99.4 F (37.4 C), temperature source Axillary, resp. rate (!) 23, height _0  (1.778 m), weight (!) 151 kg, SpO2 91 %.        Intake/Output Summary (Last 24 hours) at 01-22-2020 1010 Last data filed at 01/22/2020 5726 Gross per 24 hour  Intake 1883.84 ml  Output 2015 ml  Net -131.16 ml   Filed Weights   01/06/20 0500 01/07/20 0500 01/08/20 0500  Weight: (!) 152 kg (!) 153 kg (!) 151 kg    Examination: General: Ill-appearing man lying in bed unconscious, comfortable appearing Neuro: Examined on drips to control air hunger.  Not withdrawing to stimulation or responsive to verbal stimulation. HEENT: [place]/AT, oral mucosa dry Cardiovascular: Regular rate and rhythm, no murmurs Lungs: Mildly tachypneic with minimal abdominal accessory muscle use.  Faint sound of secretions in oropharynx Abdomen: Obese, soft, nontender Musculoskeletal: Anasarca Skin: Pallor, no rashes or ecchymoses    Assessment & Plan:   Critically ill due to acute respiratory failure in setting of encephalopathy and inability to protect airway causing acute respiratory failure, requiring intubation & mechanical ventilation.  -Status post terminal extubation -Continue fentanyl, Versed for air hunger.  Discontinue Precedex infusion. -Glycopyrrolate as needed for secretions  Acute Encephalopathy 2/2 septic encephalopathy in setting of severe sepsis due to MRSA bacteremia and meningitis. Concern for significant neurological  injury from meningitis. We discussed that hydrocephalus is a possible complication from meningitis, and could contribute to some of his acute encephalopathy but could also be associated with poor long-term prognosis regardless. Septic spinal facet arthritis with phlegmon but no  definitive abscess or discitis. -Continue comfort focused care.  Discontinue Precedex, diltiazem, amiodarone in preparation for possible transition out of the ICU to the floor.  Atrial fibrillation with rapid ventricular response, requiring amiodarone and diltiazem infusions for rate control.  Now in sinus tach. -Discontinue amiodarone and diltiazem.  Treat only apparent signs of discomfort  RLL & RUL lobar pneumonia with hemoptysis -No role for additional antibiotics  Hx DM with hyperglycemia -No role for additional monitoring or management  Acute on chronic anemia likely 2/2 critical illness -No additional intervention or monitoring  GOC -Family updated at bedside and offered support  Best practice:  Diet: Continue tube feeding Pain/Anxiety/Delirium protocol (if indicated): Continue propofol, fentanyl, precedex.  Daily wake up assessment. VAP protocol (if indicated): Bundle in place. DVT prophylaxis: SCDs/Heparin GI prophylaxis: protonix  Glucose control: SSI  Mobility: BR Code Status: DNR Family Communication: Sons updated at bedside today Disposition: ICU   This patient is critically ill with multiple organ system failure which requires frequent high complexity decision making, assessment, support, evaluation, and titration of therapies. This was completed through the application of advanced monitoring technologies and extensive interpretation of multiple databases. During this encounter critical care time was devoted to patient care services described in this note for 31 minutes.   Julian Hy, DO 2020-02-03 10:10 AM [facility]

## 2020-01-15 DEATH — deceased

## 2020-04-08 ENCOUNTER — Ambulatory Visit: Payer: Medicare Other | Admitting: Internal Medicine
